# Patient Record
Sex: Female | Born: 1945 | Race: White | Hispanic: No | Smoking: Former smoker
Health system: Southern US, Community
[De-identification: ages and names within clinical notes are randomized; demographics above are authoritative.]

## PROBLEM LIST (undated history)

## (undated) DIAGNOSIS — I499 Cardiac arrhythmia, unspecified: Secondary | ICD-10-CM

## (undated) DIAGNOSIS — J449 Chronic obstructive pulmonary disease, unspecified: Secondary | ICD-10-CM

## (undated) HISTORY — PX: PARATHYROIDECTOMY: SHX19

## (undated) HISTORY — DX: Cardiac arrhythmia, unspecified: I49.9

## (undated) HISTORY — PX: BREAST SURGERY: SHX581

---

## 2007-03-15 ENCOUNTER — Encounter: Payer: Self-pay | Admitting: Internal Medicine

## 2008-05-15 ENCOUNTER — Encounter (HOSPITAL_COMMUNITY): Admission: RE | Admit: 2008-05-15 | Discharge: 2008-06-23 | Payer: Self-pay | Admitting: Surgery

## 2008-05-27 LAB — CONVERTED CEMR LAB: Pap Smear: NORMAL

## 2008-06-17 ENCOUNTER — Encounter: Payer: Self-pay | Admitting: Emergency Medicine

## 2008-07-08 ENCOUNTER — Encounter: Payer: Self-pay | Admitting: Emergency Medicine

## 2008-08-14 ENCOUNTER — Encounter: Payer: Self-pay | Admitting: Emergency Medicine

## 2008-08-26 ENCOUNTER — Encounter (INDEPENDENT_AMBULATORY_CARE_PROVIDER_SITE_OTHER): Payer: Self-pay | Admitting: Surgery

## 2008-08-26 ENCOUNTER — Ambulatory Visit (HOSPITAL_COMMUNITY): Admission: RE | Admit: 2008-08-26 | Discharge: 2008-08-27 | Payer: Self-pay | Admitting: Surgery

## 2008-10-26 ENCOUNTER — Emergency Department (HOSPITAL_COMMUNITY): Admission: EM | Admit: 2008-10-26 | Discharge: 2008-10-26 | Payer: Self-pay | Admitting: Emergency Medicine

## 2008-10-27 ENCOUNTER — Telehealth (INDEPENDENT_AMBULATORY_CARE_PROVIDER_SITE_OTHER): Payer: Self-pay | Admitting: *Deleted

## 2008-10-28 ENCOUNTER — Encounter: Payer: Self-pay | Admitting: Emergency Medicine

## 2008-10-29 ENCOUNTER — Ambulatory Visit: Payer: Self-pay | Admitting: Emergency Medicine

## 2008-10-29 DIAGNOSIS — J45909 Unspecified asthma, uncomplicated: Secondary | ICD-10-CM | POA: Insufficient documentation

## 2008-10-29 DIAGNOSIS — J4489 Other specified chronic obstructive pulmonary disease: Secondary | ICD-10-CM | POA: Insufficient documentation

## 2008-10-29 DIAGNOSIS — J309 Allergic rhinitis, unspecified: Secondary | ICD-10-CM | POA: Insufficient documentation

## 2008-10-29 DIAGNOSIS — J449 Chronic obstructive pulmonary disease, unspecified: Secondary | ICD-10-CM | POA: Insufficient documentation

## 2008-10-30 ENCOUNTER — Encounter: Payer: Self-pay | Admitting: Emergency Medicine

## 2008-10-30 ENCOUNTER — Telehealth: Payer: Self-pay | Admitting: Emergency Medicine

## 2008-11-27 ENCOUNTER — Encounter: Payer: Self-pay | Admitting: Emergency Medicine

## 2008-11-27 ENCOUNTER — Ambulatory Visit: Payer: Self-pay | Admitting: Emergency Medicine

## 2009-01-07 ENCOUNTER — Ambulatory Visit: Payer: Self-pay | Admitting: Internal Medicine

## 2009-01-07 DIAGNOSIS — M899 Disorder of bone, unspecified: Secondary | ICD-10-CM | POA: Insufficient documentation

## 2009-01-07 DIAGNOSIS — E210 Primary hyperparathyroidism: Secondary | ICD-10-CM | POA: Insufficient documentation

## 2009-01-07 DIAGNOSIS — M949 Disorder of cartilage, unspecified: Secondary | ICD-10-CM

## 2009-01-27 ENCOUNTER — Ambulatory Visit: Payer: Self-pay | Admitting: Internal Medicine

## 2009-01-27 DIAGNOSIS — M79609 Pain in unspecified limb: Secondary | ICD-10-CM

## 2009-02-12 ENCOUNTER — Encounter: Payer: Self-pay | Admitting: Internal Medicine

## 2009-02-12 ENCOUNTER — Ambulatory Visit: Payer: Self-pay | Admitting: Internal Medicine

## 2009-02-23 ENCOUNTER — Ambulatory Visit: Payer: Self-pay | Admitting: Emergency Medicine

## 2009-02-25 ENCOUNTER — Encounter: Payer: Self-pay | Admitting: Internal Medicine

## 2009-05-15 ENCOUNTER — Ambulatory Visit: Payer: Self-pay | Admitting: Internal Medicine

## 2009-05-15 LAB — CONVERTED CEMR LAB
Alkaline Phosphatase: 57 units/L (ref 39–117)
BUN: 9 mg/dL (ref 6–23)
Basophils Absolute: 0.1 10*3/uL (ref 0.0–0.1)
Basophils Relative: 0.9 % (ref 0.0–3.0)
Bilirubin, Direct: 0.1 mg/dL (ref 0.0–0.3)
CO2: 32 meq/L (ref 19–32)
Calcium: 9.3 mg/dL (ref 8.4–10.5)
Cholesterol: 207 mg/dL — ABNORMAL HIGH (ref 0–200)
Creatinine, Ser: 0.6 mg/dL (ref 0.4–1.2)
Direct LDL: 126.5 mg/dL
Eosinophils Absolute: 0.2 10*3/uL (ref 0.0–0.7)
Lymphocytes Relative: 19.3 % (ref 12.0–46.0)
MCHC: 34 g/dL (ref 30.0–36.0)
Monocytes Absolute: 0.9 10*3/uL (ref 0.1–1.0)
Neutrophils Relative %: 67.5 % (ref 43.0–77.0)
Platelets: 295 10*3/uL (ref 150.0–400.0)
RBC: 4.14 M/uL (ref 3.87–5.11)
RDW: 11.7 % (ref 11.5–14.6)
Total Bilirubin: 0.8 mg/dL (ref 0.3–1.2)
Total CHOL/HDL Ratio: 3
Triglycerides: 50 mg/dL (ref 0.0–149.0)

## 2009-05-25 ENCOUNTER — Ambulatory Visit: Payer: Self-pay | Admitting: Emergency Medicine

## 2009-06-18 ENCOUNTER — Telehealth (INDEPENDENT_AMBULATORY_CARE_PROVIDER_SITE_OTHER): Payer: Self-pay | Admitting: *Deleted

## 2009-06-27 ENCOUNTER — Telehealth: Payer: Self-pay | Admitting: Internal Medicine

## 2009-08-14 ENCOUNTER — Ambulatory Visit: Payer: Self-pay | Admitting: Emergency Medicine

## 2009-12-17 ENCOUNTER — Ambulatory Visit: Payer: Self-pay | Admitting: Emergency Medicine

## 2010-04-25 ENCOUNTER — Telehealth (INDEPENDENT_AMBULATORY_CARE_PROVIDER_SITE_OTHER): Payer: Self-pay | Admitting: *Deleted

## 2010-05-14 ENCOUNTER — Telehealth (INDEPENDENT_AMBULATORY_CARE_PROVIDER_SITE_OTHER): Payer: Self-pay | Admitting: *Deleted

## 2010-05-14 ENCOUNTER — Ambulatory Visit: Payer: Self-pay | Admitting: Emergency Medicine

## 2010-05-18 ENCOUNTER — Telehealth (INDEPENDENT_AMBULATORY_CARE_PROVIDER_SITE_OTHER): Payer: Self-pay | Admitting: *Deleted

## 2010-05-19 ENCOUNTER — Telehealth (INDEPENDENT_AMBULATORY_CARE_PROVIDER_SITE_OTHER): Payer: Self-pay | Admitting: *Deleted

## 2010-05-24 ENCOUNTER — Telehealth (INDEPENDENT_AMBULATORY_CARE_PROVIDER_SITE_OTHER): Payer: Self-pay | Admitting: *Deleted

## 2010-05-25 ENCOUNTER — Ambulatory Visit: Payer: Self-pay | Admitting: Emergency Medicine

## 2010-05-25 DIAGNOSIS — J069 Acute upper respiratory infection, unspecified: Secondary | ICD-10-CM | POA: Insufficient documentation

## 2010-06-11 ENCOUNTER — Ambulatory Visit: Payer: Self-pay | Admitting: Emergency Medicine

## 2010-06-16 ENCOUNTER — Telehealth: Payer: Self-pay | Admitting: Emergency Medicine

## 2010-07-19 ENCOUNTER — Ambulatory Visit
Admission: RE | Admit: 2010-07-19 | Discharge: 2010-07-19 | Payer: Self-pay | Source: Home / Self Care | Attending: Emergency Medicine | Admitting: Emergency Medicine

## 2010-07-27 NOTE — Miscellaneous (Signed)
Summary: [facility]   Imported By: Lester Henderson 02/05/2010 09:33:06  _____________________________________________________________________  External Attachment:    Type:   Image     Comment:   External Document

## 2010-07-27 NOTE — Progress Notes (Signed)
Summary: bronchitis  Phone Note Call from Patient   Caller: Patient Call For: byrum Summary of Call: pt have bronchitis would like [phone] Initial call taken by: Rickard Patience,  May 19, 2010 8:32 AM  Follow-up for Phone Call        pt called yesterday and c/o  dry hacky cough x 2 days, increased SOB, and chest tightness.  Dr. Kriste Basque prescribed her ceftin, but pt states she also requested prednisone. She staets that she always take abx and prednisone. This is the only thing that makes her better.  Please advise. Carron Curie CMA  May 19, 2010 9:25 AM   Additional Follow-up for Phone Call Additional follow up Details #1::        pt called back again. she says she ALWAYS gets prednisone w/ her abx. [facility] [place] [address] ASAP as she must take this in the am "and it's already noon". pt is "very upset that she has had to call so many times re: this". if this cannot be done soon- she wants to speak to dr of the day and find out why. Tivis Ringer, CNA  May 19, 2010 12:04 PM    Additional Follow-up for Phone Call Additional follow up Details #2::    Spoke with pt and advise that message wil lbe addressed as soon as possible but he is currently see pt. Sh estated understanding but just wants to start prednisone as soon as possible becaus eit keeps her awake. I advised we will take care of message as soon as possible. Carron Curie CMA  May 19, 2010 12:15 PM  Paged Dr. Delton Coombes and explained tha the pt is demanding a pred taper for sob, cough and chest tightness. SN had called in Ceftin for her on 05/18/2010 but did not give prednisone. She says she needs the Prednisone for chest tightness and was not able to come for appt on 11/22 due to other drs appts. I explained to her that RB would agree to the Pred taper this time but in the future she would need an OV. She said no one offered an appt  today but she would have been glad to come in and see someone. She is awae that if her sxs do not improve or get worse she will need OV with RB or one of the other providers if he is not available.  Patient verbalized understanding.    Follow-up by: Michel Bickers CMA,  May 19, 2010 12:53 PM  New/Updated Medications: PREDNISONE 10 MG TABS (PREDNISONE) 4 by mouth for 3 days, 3 by mouth for 3 days, 2 by mouth for 3 days, 1 by mouth for 3 days, then stop Prescriptions: PREDNISONE 10 MG TABS (PREDNISONE) 4 by mouth for 3 days, 3 by mouth for 3 days, 2 by mouth for 3 days, 1 by mouth for 3 days, then stop  #30 x 0   Entered by:   Michel Bickers CMA   Authorized by:   Leslye Peer MD   Signed by:   Michel Bickers CMA on 05/19/2010   Method used:   Electron[facility] to        [facility]  [phone][facility] (retail)       [address]       [place], [place]  [address]       Ph: [phone] or [phone]       Fax: [phone]  RxID:   [id number]

## 2010-07-27 NOTE — Assessment & Plan Note (Signed)
Summary: COPD, hypoxemia   Visit Type:  Follow-up Copy to:  Dr Gerrit Friends Primary Judy Gonzalez/Referring Judy Gonzalez:  Judy Gonzalez  CC:  Follow up, sob better states its due to de-humidifer, and breathing is less labored.  History of Present Illness: 65 yo woman, former smoker (> 75 pk-yrs), dx with COPD around 2007 (has had PFT's at [facility]). Has been on albuterol, xopenex before. On Spiriva once daily, using combivent 1 puff q3h, since March 2008. Has had progressive DOE. ROS as below. Established care here 5/10.   ROV 02/23/09 -- FEV1 6/10 = 0.70 L. Last visit we added Symbicort to Spiriva, she has noticed a huge difference in how she feels!  Better exertional tolerance. Better sleep. No cough, no wheezing (this is an improvement). Was seen by Dr Yetta Barre, had her xanax refilled. Uses her ProAir about once a day. Has been dealing with some significant family stress, but has not gone back to smoking.   ROV 05/25/09 -- Returns for follow up today. She has been doing fairly well in general, although she has been having some trouble with family stressors, some depression due to this. She has not gone back to smoking!! No exacerbations since last visit, no hospitalizations. Taking Symbicort and Spiriva. Wears O2 reliably. Hasn't had the flu shot - doesn't want it because it "gives her the flu."   ROV 08/14/09 -- Feels that she is doing well. Breathing may be better than last visit. Hasn't restarted smoking. No exacerbations since 12/31 when she received clarithro.   ROV 12/17/09 -- F/U for COPD. Stable on current regimen, no exacerbations since last visit. She has multiple isues that she wants to discuss regarding her Vitamin suppliments, diet, her thrush, how she rinses after ICS, how she sterilizes her toothbrush, etc. She also notes that her O2 concentrator filter was dirty and never changed by [facility] until she began to have more SOB and SABA use. She brought the filter in today. Has since been changed and  she feels better.   ROV 05/14/10 -- hx COPD. I treated her for probable AE 10/30, pred + doxy. Feeling better, back to baseline. She has a humidifier that she believes is helping her breathing. Taking Spiriva + Symbicort. Rarely using her ProAir.   Preventive Screening-Counseling & Management  Alcohol-Tobacco     Alcohol drinks/day: 0     Smoking Status: quit     Packs/Day: 10 cigs per day     Year Started: 40 yrs     Year Quit: 2 yrs ago  Caffeine-Diet-Exercise     Does Patient Exercise: no  Current Medications (verified): 1)  Alprazolam 0.5 Mg Tabs (Alprazolam) .... Twice Daily As Needed 2)  Zyrtec Allergy 10 Mg Tabs (Cetirizine Hcl) .... At Bedtime 3)  Aspirin 325 Mg Tabs (Aspirin) .... Once Daily 4)  Proair Hfa 108 (90 Base) Mcg/act Aers (Albuterol Sulfate) .... 2 Puffs Three Times A Day 5)  Spiriva Handihaler 18 Mcg Caps (Tiotropium Bromide Monohydrate) .... Once Daily 6)  Citracal Plus  Tabs (Multiple Minerals-Vitamins) .... Two Times A Day 7)  Magnesium Citrate 400mg  .... 1/2 Two Times A Day 8)  Symbicort 160-4.5 Mcg/act Aero (Budesonide-Formoterol Fumarate) .... 2 Puffs Two Times A Day  Allergies (verified): 1)  ! Effexor 2)  ! * Cymbalta 3)  ! * Ambien 4)  ! * Duoneb 5)  ! * Rozerem 6)  ! Tramadol Hcl 7)  ! * Pneumonia Shot 8)  ! * Flu Vaccination  Vital Signs:  Patient  profile:   65 year old female Height:      66.5 inches Weight:      139 pounds BMI:     22.18 O2 Sat:      95 % on 2.5 L/min Temp:     97.6 degrees F oral Pulse rate:   86 / minute BP sitting:   130 / 70  (left arm)  Vitals Entered By: Renold Genta RCP, LPN (May 14, 2010 12:14 PM)  O2 Flow:  2.5 L/min CC: Follow up, sob better states its due to de-humidifer, breathing is less labored Comments Medications reviewed with patient Renold Genta RCP, LPN  May 14, 2010 12:18 PM    Physical Exam  General:  thin, somewhat anxiousthin.   Head:  normocephalic and atraumatic Nose:   no deformity, discharge, inflammation, or lesions Mouth:  no deformity or lesions Neck:  no masses, thyromegaly, or abnormal cervical nodes, no stridor Lungs:  prolonged exp, no wheeze during normal resp cycle, no wheeze on forced expiration.  Heart:  regular rate and rhythm, S1, S2 without murmurs, rubs, gallops, or clicks Abdomen:  not examined Msk:  some mild DIP deformities bilateral hands Extremities:  no clubbing, cyanosis, edema, or deformity noted Neurologic:  non-focal Skin:  Single raised pale nevus mid upper back.  Psych:  alert and cooperative normal attention span and concentration;    Impression & Recommendations:  Problem # 1:  COPD (ICD-496)  Problem # 2:  ALLERGIC RHINITIS (ICD-477.9)  Other Orders: Est. Patient Level IV (99214) T-2 View CXR (71020TC)  Patient Instructions: 1)  We will perform a CXR today 2)  Please continue your Spiriva and Symbicort 3)  Use ProAir as needed  4)  Wear your oxygen at  5)  Follow up with Dr Delton Coombes in 4 months or as needed.

## 2010-07-27 NOTE — Progress Notes (Signed)
Summary: On Call- Rx Biaxin, prednisone  Phone Note Call from Patient   Summary of Call: On call- 06/26/09- Finished prednisone taper today. Still productive cough and noting some specks of blood. Plan- Biaxin 500 mg two times a day x 7 days. Prednisone 20 mg daily x 7 days. Check wih Dr Delton Coombes in next 1-2 weeks for OV. Initial call taken by: Waymon Budge MD,  June 27, 2009 8:36 PM

## 2010-07-27 NOTE — Progress Notes (Signed)
Summary: cough and congestion no better  Phone Note Call from Patient Call back at Home Phone [phone]   Caller: Patient Call For: byrum Reason for Call: Talk to Nurse Summary of Call: Patient calling with complaints of rash, fever, sob, chills, and swollen lymph glands.  She says this has been going on since 11/24, patient was rx'd abx and prednisone on 11/23 and is no better. Initial call taken by: Lehman Prom,  May 24, 2010 9:10 AM  Follow-up for Phone Call        Pt c/o cough, SOB, low-grade fever since last week. Pt is currently on abx and Pred taper but her sxs are not improving. She did not want to see anybody other than Dr. Delton Coombes and chose to wait for OV with him on Tues., 11/29 @ 2:30pm. She was instructed to call back if her sxs got worse or go to an urgent care or ER. Pt had verbal understanding of this. Follow-up by: Michel Bickers CMA,  May 24, 2010 10:19 AM

## 2010-07-27 NOTE — Progress Notes (Signed)
Summary: bronchitis  Phone Note Call from Patient   Caller: Patient Call For: byrum Summary of Call: pt says that her bronchitis that had improved has come back (since yesterday). call pt on her cell [phone] [address] [facility] [place] Initial call taken by: Tivis Ringer, CNA,  May 18, 2010 12:58 PM  Follow-up for Phone Call        Spoke with pt.  She was last seen by RB on 05/14/10.  She is c/o dry hacky cough x 2 days, increased SOB, and chest tightness.  She states that she is wanting round of doxycycline, this is whar RB normally gives her.  Pls advise if this is okay, thanks! Allergies (verified):  1)  ! Effexor 2)  ! * Cymbalta 3)  ! * Ambien 4)  ! * Duoneb 5)  ! * Rozerem 6)  ! Tramadol Hcl 7)  ! * Pneumonia Shot 8)  ! * Flu Vaccination  Follow-up by: Vernie Murders,  May 18, 2010 2:10 PM  Additional Follow-up for Phone Call Additional follow up Details #1::        per SN: ok for doxycycline 100mg , #14 1 by mouth two times a day.  no refills.  f/u w/ RB.    rx sent to pt's pharmacy of choice.  LMOM TCB to inform pt of this. Boone Master CNA/MA  May 18, 2010 3:09 PM     Additional Follow-up for Phone Call Additional follow up Details #2::    as i was adding the doxycycilne to pt's med list, she returned my call.  patient states that is not requesting doxycycline as she was prescribed this at the end of October.  pt states that RB "usually changes it up" and that "next up is the ceftin."  pt also requesting steroids d/t tightness in the chest.  pt was also prescribed this on 10.31.11 w/ the doxycycline.  please advise, thanks! Boone Master CNA/MA  May 18, 2010 3:14 PM    per SN----ok for pt to have the ceftin 250mg   #14   1 by mouth two times a day until gone. thanks Randell Loop CMA  May 18, 2010 5:21 PM   Additional Follow-up for Phone Call Additional follow up Details #3:: Details for Additional Follow-up Action Taken: Spoke with pt and  notified her that rx was sent to pharm.  Additional Follow-up by: Vernie Murders,  May 18, 2010 5:23 PM  New/Updated Medications: CEFTIN 250 MG TABS (CEFUROXIME AXETIL) 1 two times a day until gone Prescriptions: CEFTIN 250 MG TABS (CEFUROXIME AXETIL) 1 two times a day until gone  #14 x 0   Entered by:   Vernie Murders   Authorized by:   Michele Mcalpine MD   Signed by:   Vernie Murders on 05/18/2010   Method used:   Electronically to        [facility]  [phone]* (retail)       [address]       [place], [place]  [address]       Ph: [phone] or [phone]       Fax: [phone]   RxID:   [id number]

## 2010-07-27 NOTE — Letter (Signed)
Summary: Patient's info/[facility]  Patient's info/[facility]   Imported By: Lester [place] 08/20/2009 08:35:51  _____________________________________________________________________  External Attachment:    Type:   Image     Comment:   External Document

## 2010-07-27 NOTE — Assessment & Plan Note (Signed)
Summary: URI, COPD   Visit Type:  Follow-up Copy to:  Dr Gerrit Friends Primary Provider/Referring Provider:  Durenda Guthrie  CC:  Pt c/o red rash on her face and fever x several days.  Marland Kitchen  History of Present Illness: 65 yo woman, former smoker (> 75 pk-yrs), dx with COPD around 2007 (has had PFT's at [facility]). Established care here 5/10. Has been on Symbicort + Spiriva, O2. Stopped smoking after our initial visit  ROV 12/17/09 -- F/U for COPD. Stable on current regimen, no exacerbations since last visit. She has multiple isues that she wants to discuss regarding her Vitamin suppliments, diet, her thrush, how she rinses after ICS, how she sterilizes her toothbrush, etc. She also notes that her O2 concentrator filter was dirty and never changed by [facility] until she began to have more SOB and SABA use. She brought the filter in today. Has since been changed and she feels better.   ROV 05/14/10 -- hx COPD. I treated her for probable AE 10/30, pred + doxy. Feeling better, back to baseline. She has a humidifier that she believes is helping her breathing. Taking Spiriva + Symbicort. Rarely using her ProAir.   ROV 05/25/10 -- COPD, no longer smoking. Began therapy with doxy + pred 11/22, tapering. Feels about the same. Having swollen glands, flushing, cough. URI symptoms, fever. Has been using ProAir q4h for last several days.   Current Medications (verified): 1)  Alprazolam 0.5 Mg Tabs (Alprazolam) .... Twice Daily As Needed 2)  Zyrtec Allergy 10 Mg Tabs (Cetirizine Hcl) .... At Bedtime 3)  Aspirin 325 Mg Tabs (Aspirin) .... Once Daily 4)  Proair Hfa 108 (90 Base) Mcg/act Aers (Albuterol Sulfate) .... 2 Puffs Three Times A Day 5)  Spiriva Handihaler 18 Mcg Caps (Tiotropium Bromide Monohydrate) .... Once Daily 6)  Citracal Plus  Tabs (Multiple Minerals-Vitamins) .... Two Times A Day 7)  Magnesium Citrate 400mg  .... 1/2 Two Times A Day 8)  Symbicort 160-4.5 Mcg/act Aero (Budesonide-Formoterol Fumarate)  .... 2 Puffs Two Times A Day 9)  Ceftin 250 Mg Tabs (Cefuroxime Axetil) .Marland Kitchen.. 1 Two Times A Day Until Gone 10)  Prednisone 10 Mg Tabs (Prednisone) .... 4 By Mouth For 3 Days, 3 By Mouth For 3 Days, 2 By Mouth For 3 Days, 1 By Mouth For 3 Days, Then Stop 11)  Co Q-10 100 Mg Caps (Coenzyme Q10) .Marland Kitchen.. 1 Once Daily  Allergies (verified): 1)  ! Effexor 2)  ! * Cymbalta 3)  ! * Ambien 4)  ! * Duoneb 5)  ! * Rozerem 6)  ! Tramadol Hcl 7)  ! * Pneumonia Shot 8)  ! * Flu Vaccination  Vital Signs:  Patient profile:   65 year old female Weight:      144.38 pounds O2 Sat:      90 % on 2.5 L/min pulsed Temp:     97.8 degrees F oral Pulse rate:   102 / minute BP sitting:   142 / 78  (left arm)  Vitals Entered By: Vernie Murders (May 25, 2010 2:13 PM)  O2 Flow:  2.5 L/min pulsed  Physical Exam  General:  thin, somewhat anxiousthin.   Head:  normocephalic and atraumatic Nose:  no deformity, discharge, inflammation, or lesions Mouth:  no deformity or lesions Neck:  no masses, thyromegaly, or abnormal cervical nodes, no stridor Lungs:  prolonged exp, no wheeze during normal resp cycle, no wheeze on forced expiration.  Heart:  regular rate and rhythm, S1, S2 without murmurs,  rubs, gallops, or clicks Abdomen:  not examined Msk:  some mild DIP deformities bilateral hands Extremities:  no clubbing, cyanosis, edema, or deformity noted Neurologic:  non-focal Skin:  Single raised pale nevus mid upper back.  Psych:  alert and cooperative normal attention span and concentration;    Impression & Recommendations:  Problem # 1:  COPD (ICD-496)  Problem # 2:  UPPER RESPIRATORY INFECTION (ICD-465.9)  Her updated medication list for this problem includes:    Zyrtec Allergy 10 Mg Tabs (Cetirizine hcl) .Marland Kitchen... At bedtime    Aspirin 325 Mg Tabs (Aspirin) ..... Once daily  Orders: Est. Patient Level IV (78295)  Medications Added to Medication List This Visit: 1)  Co Q-10 100 Mg Caps (Coenzyme  q10) .Marland Kitchen.. 1 once daily  Patient Instructions: 1)  Finish prescriptions for your prednisone and ceftin 2)  Continue your Inhaled medications as you are taking them 3)  Continue your oxygen.  4)  Use OTC decongestants that contain either bromphenerimine or chlorphenerimine.  5)  Follow up with Dr Delton Coombes in 2 weeks or next available.

## 2010-07-27 NOTE — Progress Notes (Signed)
Summary: URI, AE-COPD  Phone Note Call from Patient   Caller: Patient Call For: Byrum Summary of Call: Pt calling to report URI symptoms for last 3 days. Starting to have chest tightness, dry cough, DOE. No purulent mucous. Will treat for AE-COPD with pred taper and doxy. Will move her next appt to November from December. please call her to see if she is better and to move her appointment up. Thanks. Leslye Peer MD  April 25, 2010 1:27 PM   Initial call taken by: Leslye Peer MD,  April 25, 2010 1:26 PM  Follow-up for Phone Call        called pt and she staets she is feeling better today. Advised we neded to change appt. Pt states she discussed with Dr. Delton Coombes about having a pFT at time of next appt. Please advise if you would like pt to have a PFT with Nov. appt.Carron Curie CMA  April 26, 2010 9:39 AM  I'd like to insure that she has recovered before we oprder the PFT's. I'll set them up when I see her if she looks good. Leslye Peer MD  April 26, 2010 12:04 PM   Follow-up by: Leslye Peer MD,  April 26, 2010 12:04 PM  Additional Follow-up for Phone Call Additional follow up Details #1::        Spoke with pt and instructed that Dr Delton Coombes would like to wait to have PFT until after he sees her on 05-14-10.  Pt verbalized understanding. Abigail Miyamoto RN  April 26, 2010 12:21 PM     New/Updated Medications: PREDNISONE 10 MG TABS (PREDNISONE) 40mg  daily x 3days, 30mg  daily x 3days, 20mg  daily x 3days, 10mg  daily x3 days then stop. DOXYCYCLINE HYCLATE 100 MG CAPS (DOXYCYCLINE HYCLATE) 1 by mouth two times a day Prescriptions: DOXYCYCLINE HYCLATE 100 MG CAPS (DOXYCYCLINE HYCLATE) 1 by mouth two times a day  #14 x 0   Entered and Authorized by:   Leslye Peer MD   Signed by:   Leslye Peer MD on 04/25/2010   Method used:   Electronically to        [facility]  [phone]* (retail)       [address]       [place], [place]  [address]       Ph:  [phone] or [phone]       Fax: [phone]   RxID:   [id number] PREDNISONE 10 MG TABS (PREDNISONE) 40mg  daily x 3days, 30mg  daily x 3days, 20mg  daily x 3days, 10mg  daily x3 days then stop.  #30 x 0   Entered and Authorized by:   Leslye Peer MD   Signed by:   Leslye Peer MD on 04/25/2010   Method used:   Electronically to        [facility]  [phone]* (retail)       [address]       [place], [place]  [address]       Ph: [phone] or [phone]       Fax: [phone]   RxID:   [id number]

## 2010-07-27 NOTE — Progress Notes (Signed)
Summary: copy of results to [facility]  Phone Note Call from Patient Call back at Home Phone [phone]   Caller: Patient Call For: byrum Summary of Call: pt requests results of cxr (from today) be sent to dr lyle Katrinka Blazing at [facility]. ok to leave msg on pt's phone.  Initial call taken by: Tivis Ringer, CNA,  May 14, 2010 12:57 PM  Follow-up for Phone Call        Will place in Lori's box so that she can send once it comes back and he signs off on it.   Follow-up by: Vernie Murders,  May 14, 2010 1:18 PM  Additional Follow-up for Phone Call Additional follow up Details #1::        RB has not signed off on cxr report yet. I will sign off on phone note and send a flag to myself to check later this week.Michel Bickers [facility]  May 24, 2010 2:40 PM

## 2010-07-27 NOTE — Assessment & Plan Note (Signed)
Summary: COPD   Visit Type:  Follow-up Copy to:  Dr Gerrit Friends Primary Provider/Referring Provider:  Sondra Come  CC:  COPD. The patient says her inhalers cause nasal congestion. She says her breathing has improved slightly.Marland Kitchen  History of Present Illness: 65 yo woman, former smoker (> 75 pk-yrs), dx with COPD around 2007 (has had PFT's at [facility]). Has been on albuterol, xopenex before. On Spiriva once daily, using combivent 1 puff q3h, since March 2008. Has had progressive DOE. ROS as below. Established care here 5/10.   ROV 02/23/09 -- FEV1 6/10 = 0.70 L. Last visit we added Symbicort to Spiriva, she has noticed a huge difference in how she feels!  Better exertional tolerance. Better sleep. No cough, no wheezing (this is an improvement). Was seen by Dr Yetta Barre, had her xanax refilled. Uses her ProAir about once a day. Has been dealing with some significant family stress, but has not gone back to smoking.   ROV 05/25/09 -- Returns for follow up today. She has been doing fairly well in general, although she has been having some trouble with family stressors, some depression due to this. She has not gone back to smoking!! No exacerbations since last visit, no hospitalizations. Taking Symbicort and Spiriva. Wears O2 reliably. Hasn't had the flu shot - doesn't want it because it "gives her the flu."   ROV 08/14/09 -- Feels that she is doing well. Breathing may be better than last visit. Hasn't restarted smoking. No exacerbations since 12/31 when she received clarithro.   ROV 12/17/09 -- F/U for COPD. Stable on current regimen, no exacerbations since last visit. She has multiple isues that she wants to discuss regarding her Vitamin suppliments, diet, her thrush, how she rinses after ICS, how she sterilizes her toothbrush, etc. She also notes that her O2 concentrator filter was dirty and never changed by [facility] until she began to have more SOB and SABA use. She brought the filter in today. Has since been  changed and she feels better.   Current Medications (verified): 1)  Alprazolam 0.5 Mg Tabs (Alprazolam) .... Twice Daily As Needed 2)  Zyrtec Allergy 10 Mg Tabs (Cetirizine Hcl) .... At Bedtime 3)  Aspirin 325 Mg Tabs (Aspirin) .... Once Daily 4)  Proair Hfa 108 (90 Base) Mcg/act Aers (Albuterol Sulfate) .... 2 Puffs Three Times A Day 5)  Spiriva Handihaler 18 Mcg Caps (Tiotropium Bromide Monohydrate) .... Once Daily 6)  Citracal Plus  Tabs (Multiple Minerals-Vitamins) .... Two Times A Day 7)  Magnesium Citrate 400mg  .... 1/2 Two Times A Day 8)  Symbicort 160-4.5 Mcg/act Aero (Budesonide-Formoterol Fumarate) .... 2 Puffs Two Times A Day  Allergies (verified): 1)  ! Effexor 2)  ! * Cymbalta 3)  ! * Ambien 4)  ! * Duoneb 5)  ! * Rozerem 6)  ! Tramadol Hcl 7)  ! * Pneumonia Shot 8)  ! * Flu Vaccination  Vital Signs:  Patient profile:   65 year old female Height:      67 inches (170.18 cm) Weight:      137 pounds (62.27 kg) BMI:     21.53 O2 Sat:      97 % on Room air Temp:     98.2 degrees F (36.78 degrees C) oral Pulse rate:   71 / minute BP sitting:   114 / 72  (left arm) Cuff size:   regular  Vitals Entered By: Michel Bickers CMA (December 17, 2009 1:44 PM)  O2 Sat at Rest %:  97 O2 Flow:  Room air CC: COPD. The patient says her inhalers cause nasal congestion. She says her breathing has improved slightly. Comments Medications reviewed with the patient. Daytime phone verified. Michel Bickers CMA  December 17, 2009 1:45 PM   Physical Exam  General:  thin, somewhat anxiousthin.   Head:  normocephalic and atraumatic Nose:  no deformity, discharge, inflammation, or lesions Mouth:  no deformity or lesions Neck:  no masses, thyromegaly, or abnormal cervical nodes, no stridor Lungs:  prolonged exp, no wheeze during normal resp cycle, no wheeze on forced expiration.  Heart:  regular rate and rhythm, S1, S2 without murmurs, rubs, gallops, or clicks Abdomen:  not examined Msk:  some  mild DIP deformities bilateral hands Extremities:  no clubbing, cyanosis, edema, or deformity noted Neurologic:  non-focal Skin:  Single raised pale nevus mid upper back.  Psych:  alert and cooperative normal attention span and concentration;    Impression & Recommendations:  Problem # 1:  COPD (ICD-496) Same BDs O2 as ordered with filter change q67mo ROV 6 mo she has refused flu shot in the past, will revisit with her next fall, see if she would like to get it  Problem # 2:  ALLERGIC RHINITIS (ICD-477.9)  Her updated medication list for this problem includes:    Zyrtec Allergy 10 Mg Tabs (Cetirizine hcl) .Marland Kitchen... At bedtime  Orders: Est. Patient Level IV (16109)  Medications Added to Medication List This Visit: 1)  Aspirin 325 Mg Tabs (Aspirin) .... Once daily 2)  Proair Hfa 108 (90 Base) Mcg/act Aers (Albuterol sulfate) .... 2 puffs three times a day 3)  Citracal Plus Tabs (Multiple minerals-vitamins) .... Two times a day 4)  Magnesium Citrate 400mg   .... 1/2 two times a day  Patient Instructions: 1)  Please continue your Spiriva once daily and Symbicort two times a day as you are taking them  2)  Continue your oxygen at all times. [facility] should change your concentrator filter EVERY 6 months 3)  Follow up with Dr Delton Coombes in 6 months or as needed for any problems.

## 2010-07-27 NOTE — Miscellaneous (Signed)
Summary: Living Will  Living Will   Imported By: [place] Delton 02/05/2010 09:28:50  _____________________________________________________________________  External Attachment:    Type:   Image     Comment:   External Document

## 2010-07-27 NOTE — Assessment & Plan Note (Signed)
Summary: COPD   Visit Type:  Follow-up Copy to:  Dr Gerrit Friends Primary Provider/Referring Provider:  Sondra Come  CC:  COPD. The patient states there is no changes in her breathing.Marland Kitchen  History of Present Illness: 65 yo woman, former smoker (> 75 pk-yrs), dx with COPD around 2007 (has had PFT's at [facility]). Has been on albuterol, xopenex before. On Spiriva once daily, using combivent 1 puff q3h, since March 2008. Has had progressive DOE. ROS as below. Established care here 5/10.   ROV 11/27/08 -- last time we discovered exertional hypoxemia, she has nocturnal oximetry that shows desaturations. Has been wearing oxygen since last visit - has more energy, able to do more without stopping. We changed combivent to ProAir as needed at our initial visit, she believes that her breathing may have declined  ROV 02/23/09 -- FEV1 6/10 = 0.70 L. Last visit we added Symbicort to Spiriva, she has noticed a huge difference in how she feels!  Better exertional tolerance. Better sleep. No cough, no wheezing (this is an improvement). Was seen by Dr Yetta Barre, had her xanax refilled. Uses her ProAir about once a day. Has been dealing with some significant family stress, but has not gone back to smoking.   ROV 05/25/09 -- Returns for follow up today. She has been doing fairly well in general, although she has been having some trouble with family stressors, some depression due to this. She has not gone back to smoking!! No exacerbations since last visit, no hospitalizations. Taking Symbicort and Spiriva. Wears O2 reliably. Hasn't had the flu shot - doesn't want it because it "gives her the flu."   ROV 08/14/09 -- Feels that she is doing well. Breathing may be better than last visit. Hasn't restarted smoking. No exacerbations since 12/31 when she received clarithro.   Allergies: 1)  ! Effexor 2)  ! * Cymbalta 3)  ! * Ambien 4)  ! * Duoneb 5)  ! * Rozerem 6)  ! Tramadol Hcl 7)  ! * Pneumonia Shot 8)  ! * Flu  Vaccination  Vital Signs:  Patient profile:   65 year old female Height:      67 inches (170.18 cm) Weight:      138 pounds (62.73 kg) BMI:     21.69 O2 Sat:      93 % on 2.5 L/min Temp:     97.8 degrees F (36.56 degrees C) oral Pulse rate:   95 / minute BP sitting:   140 / 76  (left arm) Cuff size:   regular  Vitals Entered By: Michel Bickers CMA (August 14, 2009 11:55 AM)  O2 Sat at Rest %:  93 O2 Flow:  2.5 L/min CC: COPD. The patient states there is no changes in her breathing. Comments The patient would not give me her list of medications or allergies so that I could update this in her chart. She preferred to go over this with Dr. Delton Coombes. Michel Bickers [facility]  August 14, 2009 11:58 AM   Physical Exam  General:  thin, somewhat anxiousthin.   Head:  normocephalic and atraumatic Nose:  no deformity, discharge, inflammation, or lesions Mouth:  no deformity or lesions Neck:  no masses, thyromegaly, or abnormal cervical nodes, no stridor Lungs:  prolonged exp, no wheeze during normal resp cycle, no wheeze on forced expiration.  Heart:  regular rate and rhythm, S1, S2 without murmurs, rubs, gallops, or clicks Abdomen:  not examined Msk:  some mild DIP deformities bilateral hands Extremities:  no clubbing, cyanosis, edema, or deformity noted Neurologic:  non-focal Skin:  Single raised pale nevus mid upper back.  Psych:  anxious but well-oriented.  Good historian.     Impression & Recommendations:  Problem # 1:  COPD (ICD-496)  Other Orders: Est. Patient Level IV (13086)  Patient Instructions: 1)  Continue your same inhaled medications as your are taking them.  2)  Continue your oxygen at all times.  3)  Follow up with Dr Delton Coombes in 4 months or as needed.  Prescriptions: PROAIR HFA 108 (90 BASE) MCG/ACT AERS (ALBUTEROL SULFATE) 2 puffs q4h as needed for SOB  #8.5 Inhaler x 11   Entered and Authorized by:   Leslye Peer MD   Signed by:   Leslye Peer MD on 08/14/2009    Method used:   Electronically to        [facility]  [phone]* (retail)       [address]       [place], [place]  [address]       Ph: [phone] or [phone]       Fax: [phone]   RxID:   [id number] SYMBICORT 160-4.5 MCG/ACT AERO (BUDESONIDE-FORMOTEROL FUMARATE) 2 puffs two times a day  #1 x 11   Entered and Authorized by:   Leslye Peer MD   Signed by:   Leslye Peer MD on 08/14/2009   Method used:   Electronically to        [facility]  [phone]* (retail)       [address]       [place], [place]  [address]       Ph: [phone] or [phone]       Fax: [phone]   RxID:   [id number] SPIRIVA HANDIHALER 18 MCG CAPS (TIOTROPIUM BROMIDE MONOHYDRATE) once daily  #30 x 11   Entered and Authorized by:   Leslye Peer MD   Signed by:   Leslye Peer MD on 08/14/2009   Method used:   Electronically to        [facility]  [phone]* (retail)       [address]       [place], [place]  [address]       Ph: [phone] or [phone]       Fax: [phone]   RxID:   [id number]    Immunization History:  Tetanus/Td Immunization History:    Tetanus/Td:  historical (07/10/2009)

## 2010-07-29 NOTE — Assessment & Plan Note (Signed)
Summary: COPD   Visit Type:  Follow-up Copy to:  Dr Gerrit Friends Primary Provider/Referring Provider:  Durenda Guthrie  CC:  COPD...URI...pt says it is difficult to breathe, still has cough, nausea this morning, and sneezing.  History of Present Illness: 65 yo woman, former smoker (> 75 pk-yrs), dx with COPD around 2007 (has had PFT's at [facility]). Established care here 5/10. Has been on Symbicort + Spiriva, O2. Stopped smoking after our initial visit  ROV 12/17/09 -- F/U for COPD. Stable on current regimen, no exacerbations since last visit. She has multiple isues that she wants to discuss regarding her Vitamin suppliments, diet, her thrush, how she rinses after ICS, how she sterilizes her toothbrush, etc. She also notes that her O2 concentrator filter was dirty and never changed by [facility] until she began to have more SOB and SABA use. She brought the filter in today. Has since been changed and she feels better.   ROV 05/14/10 -- hx COPD. I treated her for probable AE 10/30, pred + doxy. Feeling better, back to baseline. She has a humidifier that she believes is helping her breathing. Taking Spiriva + Symbicort. Rarely using her ProAir.   ROV 05/25/10 -- COPD, no longer smoking. Began therapy with doxy + pred 11/22, tapering. Feels about the same. Having swollen glands, flushing, cough. URI symptoms, fever. Has been using ProAir q4h for last several days.   ROV 06/11/10 -- COPD, seen 2 weeks ago for URI. Her cough, other symptoms are resolved. She is currently having an intestinal viral infxn. Taking good diet, lots of fluids. Taking her Symbicort and Spiriva.   Current Medications (verified): 1)  Alprazolam 0.5 Mg Tabs (Alprazolam) .... Twice Daily As Needed 2)  Zyrtec Allergy 10 Mg Tabs (Cetirizine Hcl) .... At Bedtime 3)  Aspirin 325 Mg Tabs (Aspirin) .... Once Daily 4)  Proair Hfa 108 (90 Base) Mcg/act Aers (Albuterol Sulfate) .... 2 Puffs Three Times A Day 5)  Spiriva Handihaler 18 Mcg Caps  (Tiotropium Bromide Monohydrate) .... Once Daily 6)  Citracal Plus  Tabs (Multiple Minerals-Vitamins) .... Two Times A Day 7)  Magnesium Citrate 400mg  .... 1/2 Two Times A Day 8)  Symbicort 160-4.5 Mcg/act Aero (Budesonide-Formoterol Fumarate) .... 2 Puffs Two Times A Day 9)  Co Q-10 100 Mg Caps (Coenzyme Q10) .Marland Kitchen.. 1 Once Daily  Allergies (verified): 1)  ! Effexor 2)  ! * Cymbalta 3)  ! * Ambien 4)  ! * Duoneb 5)  ! * Rozerem 6)  ! Tramadol Hcl 7)  ! * Pneumonia Shot 8)  ! * Flu Vaccination  Vital Signs:  Patient profile:   65 year old female Height:      66.5 inches (168.91 cm) Weight:      144 pounds (65.45 kg) BMI:     22.98 O2 Sat:      93 % on 2.5 L/min pulsing Temp:     97.8 degrees F (36.56 degrees C) oral Pulse rate:   92 / minute BP sitting:   138 / 80  (left arm) Cuff size:   regular  Vitals Entered By: Michel Bickers CMA (June 11, 2010 3:59 PM)  O2 Sat at Rest %:  93 O2 Flow:  2.5 L/min pulsing CC: COPD...URI...pt says it is difficult to breathe, still has cough, nausea this morning, sneezing Comments Medications reviewed with patient Michel Bickers [facility]  June 11, 2010 3:58 PM   Physical Exam  General:  thin, somewhat anxiousthin.   Head:  normocephalic and atraumatic  Nose:  no deformity, discharge, inflammation, or lesions Mouth:  no deformity or lesions Neck:  no masses, thyromegaly, or abnormal cervical nodes, no stridor Lungs:  prolonged exp, no wheeze during normal resp cycle, no wheeze on forced expiration.  Heart:  regular rate and rhythm, S1, S2 without murmurs, rubs, gallops, or clicks Abdomen:  not examined Msk:  some mild DIP deformities bilateral hands Extremities:  no clubbing, cyanosis, edema, or deformity noted Neurologic:  non-focal Skin:  Single raised pale nevus mid upper back.  Psych:  alert and cooperative normal attention span and concentration;    Impression & Recommendations:  Problem # 1:  COPD (ICD-496)  Problem # 2:   ALLERGIC RHINITIS (ICD-477.9)  Her updated medication list for this problem includes:    Zyrtec Allergy 10 Mg Tabs (Cetirizine hcl) .Marland Kitchen... At bedtime  Other Orders: Est. Patient Level III (16109)  Patient Instructions: 1)  Please continue your oxygen, Spiriva and Symbicort.  2)  Use your ProAir as needed  3)  Follow up with Dr Delton Coombes in 4 months or as needed.

## 2010-07-29 NOTE — Assessment & Plan Note (Signed)
Summary: COPD   Visit Type:  Follow-up Copy to:  Dr Gerrit Friends Primary Provider/Referring Provider:  Durenda Guthrie  CC:  Pt says her breathing has improved.  Needs DMV forms filled out today.Marland Kitchen  History of Present Illness: 65 yo woman, former smoker (> 75 pk-yrs), dx with COPD around 2007 (has had PFT's at [facility]). Established care here 5/10. Has been on Symbicort + Spiriva, O2. Stopped smoking after our initial visit  ROV 12/17/09 -- F/U for COPD. Stable on current regimen, no exacerbations since last visit. She has multiple isues that she wants to discuss regarding her Vitamin suppliments, diet, her thrush, how she rinses after ICS, how she sterilizes her toothbrush, etc. She also notes that her O2 concentrator filter was dirty and never changed by [facility] until she began to have more SOB and SABA use. She brought the filter in today. Has since been changed and she feels better.   ROV 05/14/10 -- hx COPD. I treated her for probable AE 10/30, pred + doxy. Feeling better, back to baseline. She has a humidifier that she believes is helping her breathing. Taking Spiriva + Symbicort. Rarely using her ProAir.   ROV 05/25/10 -- COPD, no longer smoking. Began therapy with doxy + pred 11/22, tapering. Feels about the same. Having swollen glands, flushing, cough. URI symptoms, fever. Has been using ProAir q4h for last several days.   ROV 06/11/10 -- COPD, seen 2 weeks ago for URI. Her cough, other symptoms are resolved. She is currently having an intestinal viral infxn. Taking good diet, lots of fluids. Taking her Symbicort and Spiriva.   ROV 07/19/10 -- COPD, doing well since last visit. Feels better since her viral gastroenteritis. Taking Spiriva and Symbicort. Rare ProAir use. Needs paperwork filled out for [facility] today so that she can drive with her O2 on.   Current Medications (verified): 1)  Alprazolam 0.5 Mg Tabs (Alprazolam) .... Twice Daily As Needed 2)  Zyrtec Allergy 10 Mg Tabs (Cetirizine Hcl)  .... At Bedtime 3)  Aspirin 325 Mg Tabs (Aspirin) .... Once Daily 4)  Proair Hfa 108 (90 Base) Mcg/act Aers (Albuterol Sulfate) .... 2 Puffs Three Times A Day 5)  Spiriva Handihaler 18 Mcg Caps (Tiotropium Bromide Monohydrate) .... Once Daily 6)  Citracal Plus  Tabs (Multiple Minerals-Vitamins) .... Two Times A Day 7)  Magnesium Oxide 400 Mg Tabs (Magnesium Oxide) .... 1/2 By Mouth Two Times A Day 8)  Symbicort 160-4.5 Mcg/act Aero (Budesonide-Formoterol Fumarate) .... 2 Puffs Two Times A Day 9)  Co Q-10 100 Mg Caps (Coenzyme Q10) .Marland Kitchen.. 1 Once Daily  Allergies (verified): 1)  ! Effexor 2)  ! * Cymbalta 3)  ! * Ambien 4)  ! * Duoneb 5)  ! * Rozerem 6)  ! Tramadol Hcl 7)  ! * Pneumonia Shot 8)  ! * Flu Vaccination  Vital Signs:  Patient profile:   65 year old female Height:      66.5 inches (168.91 cm) Weight:      142.38 pounds (64.72 kg) BMI:     22.72 O2 Sat:      94 % on 2.5 L/min Temp:     97.9 degrees F (36.61 degrees C) oral Pulse rate:   102 / minute BP sitting:   130 / 74  (left arm) Cuff size:   regular  Vitals Entered By: Michel Bickers CMA (July 19, 2010 4:07 PM)  O2 Sat at Rest %:  94 O2 Flow:  2.5 L/min CC: Pt says her  breathing has improved.  Needs DMV forms filled out today. Comments Medications reviewed with patient Michel Bickers [facility]  July 19, 2010 4:08 PM   Physical Exam  General:  thin, somewhat anxiousthin.   Head:  normocephalic and atraumatic Nose:  no deformity, discharge, inflammation, or lesions Mouth:  no deformity or lesions Neck:  no masses, thyromegaly, or abnormal cervical nodes, no stridor Lungs:  prolonged exp, no wheeze during normal resp cycle, no wheeze on forced expiration.  Heart:  regular rate and rhythm, S1, S2 without murmurs, rubs, gallops, or clicks Abdomen:  not examined Msk:  some mild DIP deformities bilateral hands Extremities:  no clubbing, cyanosis, edema, or deformity noted Neurologic:  non-focal Skin:  Single raised  pale nevus mid upper back.  Psych:  alert and cooperative normal attention span and concentration;    Impression & Recommendations:  Problem # 1:  COPD (ICD-496)  Medications Added to Medication List This Visit: 1)  Magnesium Oxide 400 Mg Tabs (Magnesium oxide) .... 1/2 by mouth two times a day  Other Orders: Est. Patient Level IV (16109)  Patient Instructions: 1)  Please continue your oxygen as ordered 2)  Continue your Spiriva and Symbicort 3)  Follow with Dr Delton Coombes in 4 months or as needed

## 2010-07-29 NOTE — Progress Notes (Signed)
Summary: refill spiriva  Phone Note Call from Patient Call back at Home Phone [phone]   Caller: Patient Call For: byrum Reason for Call: Refill Medication Summary of Call: Pt states she just went on medicare 12/1 and wants to get a 90-day supply of spiriva handihaler 18 micrograms before the year is out pls advise.//[facility] [address] Initial call taken by: Darletta Moll,  June 16, 2010 12:44 PM  Follow-up for Phone Call        refill sent pt aware.Carron Curie CMA  June 16, 2010 3:27 PM     Prescriptions: SPIRIVA HANDIHALER 18 MCG CAPS (TIOTROPIUM BROMIDE MONOHYDRATE) once daily  #90 x 1   Entered by:   Carron Curie CMA   Authorized by:   Leslye Peer MD   Signed by:   Carron Curie CMA on 06/16/2010   Method used:   Electronically to        [facility]  [phone]* (retail)       [address]       [place], [place]  [address]       Ph: [phone] or [phone]       Fax: [phone]   RxID:   [id number]

## 2010-09-22 ENCOUNTER — Encounter: Payer: Self-pay | Admitting: Critical Care Medicine

## 2010-09-22 ENCOUNTER — Ambulatory Visit (INDEPENDENT_AMBULATORY_CARE_PROVIDER_SITE_OTHER): Payer: Medicare Other | Admitting: Critical Care Medicine

## 2010-09-22 ENCOUNTER — Encounter: Payer: Self-pay | Admitting: Emergency Medicine

## 2010-09-22 VITALS — BP 130/60 | HR 87 | Temp 97.3°F | Ht 66.0 in | Wt 140.2 lb

## 2010-09-22 DIAGNOSIS — J209 Acute bronchitis, unspecified: Secondary | ICD-10-CM

## 2010-09-22 DIAGNOSIS — J4489 Other specified chronic obstructive pulmonary disease: Secondary | ICD-10-CM

## 2010-09-22 DIAGNOSIS — J449 Chronic obstructive pulmonary disease, unspecified: Secondary | ICD-10-CM

## 2010-09-22 DIAGNOSIS — J309 Allergic rhinitis, unspecified: Secondary | ICD-10-CM

## 2010-09-22 MED ORDER — FLUTICASONE PROPIONATE 50 MCG/ACT NA SUSP: 2.0000 | Freq: Every day | NASAL | Status: DC

## 2010-09-22 MED ORDER — CLARITHROMYCIN 500 MG PO TABS: 500.0000 mg | ORAL_TABLET | Freq: Two times a day (BID) | ORAL | Status: AC

## 2010-09-22 MED ORDER — PREDNISONE 10 MG PO TABS: 10.0000 mg | ORAL_TABLET | Freq: Every day | ORAL | Status: AC

## 2010-09-22 NOTE — Assessment & Plan Note (Signed)
Flare of rhinitis with spring pollen Plan flonase once daily

## 2010-09-22 NOTE — Patient Instructions (Addendum)
Take Prednisone 10mg  Take 4 for two days, three for two days, two for two days, then one for two days and stop Take Biaxin one twice daily for 7days Use Saline to rinse out nares Flonase two sprays each nostril daily x one cannister No other medication changes Return Dr Delton Coombes in 6weeks or sooner if necessary

## 2010-09-22 NOTE — Assessment & Plan Note (Signed)
Acute tracheobronchitis with flare Acute rhinitis with early sinusitis Plan Biaxin x 7days Saline rinse Pulse prednisone

## 2010-09-22 NOTE — Progress Notes (Signed)
  Subjective:    Patient ID: Akirra Lacerda, female    DOB: Nov 01, 1945, 65 y.o.   MRN: [id number]  Cough This is a new problem. The current episode started in the past 7 days. The problem has been gradually worsening. Episode frequency: intermittently throughout the day. The cough is productive of sputum (occ non productive, not much cough at night). Associated symptoms include chest pain, chills, a fever, headaches, myalgias, nasal congestion, postnasal drip, rhinorrhea, shortness of breath and wheezing. Pertinent negatives include no heartburn, hemoptysis, sore throat or sweats. Associated symptoms comments: Notes chest tightness. The symptoms are aggravated by lying down. Her past medical history is significant for bronchitis and COPD. There is no history of pneumonia.  Shortness of Breath This is a chronic problem. The current episode started in the past 7 days. The problem occurs daily. The problem has been gradually worsening. Associated symptoms include chest pain, a fever, headaches, rhinorrhea, sputum production and wheezing. Pertinent negatives include no hemoptysis, leg pain, leg swelling, sore throat or syncope. Her past medical history is significant for COPD. There is no history of pneumonia.    65 yo woman, former smoker (> 75 pk-yrs), dx with COPD around 2007 (has had PFT's at [facility]). Established care here 5/10. Has been on Symbicort + Spiriva, O2. Stopped smoking after our initial visit  09/22/2010 Work in for [employer] pt.  Pt with COPD. On symbicort /spiriva.  Now for one week,  More cough, ?pollen related.  Mucus now yellow, pt is aching, chest is tight, more dyspnea, low grad fever.  T99.   See above symptoms  Review of Systems  Constitutional: Positive for fever and chills.  HENT: Positive for rhinorrhea and postnasal drip. Negative for sore throat.   Respiratory: Positive for cough, sputum production, shortness of breath and wheezing. Negative for hemoptysis.   Cardiovascular:  Positive for chest pain. Negative for leg swelling and syncope.  Gastrointestinal: Negative for heartburn.  Musculoskeletal: Positive for myalgias.  Neurological: Positive for headaches.       Objective:   Physical Exam Gen: Pleasant, well-nourished, in no distress,  normal affect  ENT: erythema ,  mouth clear,  oropharynx clear, mod postnasal drip  Neck: No JVD, no TMG, no carotid bruits  Lungs: No use of accessory muscles, no dullness to percussion, exp wheezes, poor airflow  Cardiovascular: RRR, heart sounds normal, no murmur or gallops, no peripheral edema  Abdomen: soft and NT, no HSM,  BS normal  Musculoskeletal: No deformities, no cyanosis or clubbing  Neuro: alert, non focal  Skin: Warm, no lesions or rashes         Assessment & Plan:

## 2010-09-28 ENCOUNTER — Telehealth: Payer: Self-pay | Admitting: Emergency Medicine

## 2010-09-28 MED ORDER — DOXYCYCLINE HYCLATE 100 MG PO CAPS: ORAL_CAPSULE | ORAL | Status: DC

## 2010-09-28 NOTE — Telephone Encounter (Signed)
Per sn pt can have doxycycline but pt needs to finish current round of rped and f/u with Dr. Delton Coombes. TD spoke with pt. Rx was sent to pharmacy

## 2010-09-28 NOTE — Telephone Encounter (Signed)
Per SN offer Levaquin 750 mg #7 1 po qd and f/u w/ Dr. Delton Coombes ASAP. Called and spoke with pt and she states she does not feel comfortable taking Levaquin bc she has never taken that before and states she has many medication allergies she does not want to take a chance of having a reaction. I advised pt we have a list of her medication allergies and is aware of what she is allergic to. Pt requesting to have doxycycline called in as well as having prednisone called in bc pt states "Dr. Delton Coombes does this for her all the time when she calls in". Pt was not happy with just having any abx called in. Pt is coming in to see Dr. Delton Coombes on 4/10 at 2:30. Please advise Dr. Kriste Basque. Thanks  Allergies  Allergen Reactions  . Duloxetine   . Duoneb   . Influenza Virus Vaccine   . Pneumococcal Vaccine   . Ramelteon   . Tramadol Hcl   . Venlafaxine   . Zolpidem Tartrate

## 2010-09-28 NOTE — Telephone Encounter (Signed)
Called, spoke with pt.  She was seen by Dr. Delford Field on 09/22/10 for acute visit.  States she has improved but still not better.  States she is still having chest tightness but this has improved, weakness, and some SOB with exertion.  States fever did not break until last night.  It is 98.6 this am but states "this is a fever for me."  She has 2 doses of biaxin left and 2 doses of prednisone left.  States Br. Byrum has her on a cycle of 3 different abx - the next one would be doxy.  I offered her OV for tomorrow but she declined.  She is requesting something else be called in for her.  Dr. Delton Coombes and Dr. Delford Field are out of the office.  Will forward message to doc of the day to address -- Dr. Kriste Basque, pls advise. Thanks! Allergies verified [facility] [address]  Allergies  Allergen Reactions  . Duloxetine   . Duoneb   . Influenza Virus Vaccine   . Pneumococcal Vaccine   . Ramelteon   . Tramadol Hcl   . Venlafaxine   . Zolpidem Tartrate   ;

## 2010-09-28 NOTE — Telephone Encounter (Signed)
Patient called back stated that she is not trying to be difficult but she does not want to take antibiotics out of the Levaquin group. Stated that one of the side effects is problems with your brain and she has had this in the past. She states that if Dr Kriste Basque doesn't think that Doxycyline will not help then she would rather have Ceftin if possible. She would also like a script for prednisone. She isn't trying to prescribe for her self but she knows what works for her. She is very tired,  weak and has a fever and she would like to get some medicine that will help her. She can be reached at [phone]

## 2010-09-30 ENCOUNTER — Encounter: Payer: Self-pay | Admitting: Emergency Medicine

## 2010-10-05 ENCOUNTER — Encounter: Payer: Self-pay | Admitting: Emergency Medicine

## 2010-10-05 ENCOUNTER — Ambulatory Visit (INDEPENDENT_AMBULATORY_CARE_PROVIDER_SITE_OTHER): Payer: Medicare Other | Admitting: Emergency Medicine

## 2010-10-05 VITALS — BP 126/72 | HR 87 | Temp 98.1°F | Ht 66.0 in | Wt 139.4 lb

## 2010-10-05 DIAGNOSIS — J4489 Other specified chronic obstructive pulmonary disease: Secondary | ICD-10-CM

## 2010-10-05 DIAGNOSIS — J449 Chronic obstructive pulmonary disease, unspecified: Secondary | ICD-10-CM

## 2010-10-05 LAB — URINALYSIS, ROUTINE W REFLEX MICROSCOPIC
Nitrite: NEGATIVE
Specific Gravity, Urine: 1.007 (ref 1.005–1.030)
Urobilinogen, UA: 0.2 mg/dL (ref 0.0–1.0)

## 2010-10-05 LAB — COMPREHENSIVE METABOLIC PANEL
ALT: 21 U/L (ref 0–35)
AST: 24 U/L (ref 0–37)
Alkaline Phosphatase: 73 U/L (ref 39–117)
CO2: 31 mEq/L (ref 19–32)
Calcium: 9.8 mg/dL (ref 8.4–10.5)
GFR calc Af Amer: >60 mL/min (ref >60)
GFR calc non Af Amer: >60 mL/min (ref >60)
Potassium: 4.2 mEq/L (ref 3.5–5.1)
Sodium: 143 mEq/L (ref 135–145)
Total Protein: 6.9 g/dL (ref 6.0–8.3)

## 2010-10-05 LAB — BLOOD GAS, ARTERIAL
Bicarbonate: 27.7 mEq/L — ABNORMAL HIGH (ref 20.0–24.0)
TCO2: 23.7 mmol/L (ref 0–100)
pH, Arterial: 7.476 — ABNORMAL HIGH (ref 7.350–7.400)
pO2, Arterial: 90.8 mmHg (ref 80.0–100.0)

## 2010-10-05 LAB — CARDIAC PANEL(CRET KIN+CKTOT+MB+TROPI): Relative Index: INVALID (ref 0.0–2.5)

## 2010-10-05 NOTE — Progress Notes (Signed)
  Subjective:    Patient ID: Judy Gonzalez, female    DOB: 01-09-1946, 65 y.o.   MRN: [id number]  HPI 65 yo woman, former smoker (> 75 pk-yrs), dx with COPD around 2007 (has had PFT's at [facility]). Established care here 5/10. Has been on Symbicort + Spiriva, O2. Stopped smoking after our initial visit  ROV 07/19/10 -- COPD, doing well since last visit. Feels better since her viral gastroenteritis. Taking Spiriva and Symbicort. Rare ProAir use. Needs paperwork filled out for [facility] today so that she can drive with her O2 on.   ROV 10/05/10 -- COPD, managed on Spiriva + Symbicort. Seen 3/28 by Dr Delford Field for possible acute bronchitis, treated with pred taper and biaxin. Her breathing and cough are better, but she is still worried because she is having fever - T 98.6 - 99.2. She has been lethargic, aches.    Review of Systems  Constitutional: Positive for fever and fatigue.  HENT: Negative for congestion.   Respiratory: Negative for cough, shortness of breath, wheezing and stridor.        Objective:   Physical Exam Gen: Pleasant, well-nourished, in no distress,  normal affect  ENT: No lesions,  mouth clear,  oropharynx clear, no postnasal drip  Neck: No JVD, no TMG, no carotid bruits  Lungs: No use of accessory muscles, no dullness to percussion, clear without rales or rhonchi  Cardiovascular: RRR, heart sounds normal, no murmur or gallops, no peripheral edema  Abdomen: soft and NT, no HSM,  BS normal  Musculoskeletal: No deformities, no cyanosis or clubbing  Neuro: alert, non focal  Skin: Warm, no lesions or rashes          Assessment & Plan:

## 2010-10-05 NOTE — Assessment & Plan Note (Signed)
AE resolved, now back close to baseline; may be recovering from residual viral syndrome with low grade temps - same regimen

## 2010-10-05 NOTE — Patient Instructions (Signed)
Please continue your oxygen Continue your inhaled medications as you are taking them Call our office if you worsen in any way Follow up with Dr Delton Coombes in 4 months or sooner if needed

## 2010-10-07 LAB — CALCIUM
Calcium: 8 mg/dL — ABNORMAL LOW (ref 8.4–10.5)
Calcium: 8.8 mg/dL (ref 8.4–10.5)

## 2010-10-12 LAB — URINALYSIS, ROUTINE W REFLEX MICROSCOPIC
Bilirubin Urine: NEGATIVE
Ketones, ur: NEGATIVE mg/dL
Nitrite: NEGATIVE
Protein, ur: NEGATIVE mg/dL
Urobilinogen, UA: 0.2 mg/dL (ref 0.0–1.0)
pH: 7.5 (ref 5.0–8.0)

## 2010-10-12 LAB — CBC
HCT: 44.2 % (ref 36.0–46.0)
Platelets: 264 10*3/uL (ref 150–400)
RDW: 12.4 % (ref 11.5–15.5)

## 2010-10-12 LAB — BASIC METABOLIC PANEL
BUN: 7 mg/dL (ref 6–23)
GFR calc non Af Amer: >60 mL/min (ref >60)
Glucose, Bld: 101 mg/dL — ABNORMAL HIGH (ref 70–99)
Potassium: 4.3 mEq/L (ref 3.5–5.1)

## 2010-10-12 LAB — DIFFERENTIAL
Basophils Absolute: 0.1 10*3/uL (ref 0.0–0.1)
Eosinophils Absolute: 0.1 10*3/uL (ref 0.0–0.7)
Eosinophils Relative: 2 % (ref 0–5)
Lymphocytes Relative: 20 % (ref 12–46)

## 2010-10-12 LAB — PROTIME-INR: Prothrombin Time: 13.5 seconds (ref 11.6–15.2)

## 2010-10-14 ENCOUNTER — Other Ambulatory Visit: Payer: Self-pay | Admitting: Emergency Medicine

## 2010-10-14 DIAGNOSIS — J449 Chronic obstructive pulmonary disease, unspecified: Secondary | ICD-10-CM

## 2010-10-21 ENCOUNTER — Telehealth: Payer: Self-pay | Admitting: Emergency Medicine

## 2010-10-21 NOTE — Telephone Encounter (Signed)
Pt c/o incr temp to 99.4, very lethargic and cough is prod. Pt aware RB is out of the office but agrees to see Tp tomorrow, Fri., 10/22/2010 @ 9:30 am. Will seek emergency help if her symptoms get worse before then.

## 2010-10-21 NOTE — Telephone Encounter (Signed)
PT CALLED BACK AGAIN- WANTS TO MAKE SURE NURSE CALLS HER BACK TODAY. Judy Gonzalez

## 2010-10-22 ENCOUNTER — Ambulatory Visit (INDEPENDENT_AMBULATORY_CARE_PROVIDER_SITE_OTHER)
Admission: RE | Admit: 2010-10-22 | Discharge: 2010-10-22 | Disposition: A | Discharge status: Home / Self Care | Payer: Medicare Other | Source: Ambulatory Visit | Attending: Adult Health | Admitting: Adult Health

## 2010-10-22 ENCOUNTER — Encounter: Payer: Self-pay | Admitting: Adult Health

## 2010-10-22 ENCOUNTER — Ambulatory Visit (INDEPENDENT_AMBULATORY_CARE_PROVIDER_SITE_OTHER): Payer: Medicare Other | Admitting: Adult Health

## 2010-10-22 VITALS — BP 124/70 | HR 96 | Temp 97.0°F | Ht 66.75 in | Wt 139.0 lb

## 2010-10-22 DIAGNOSIS — J449 Chronic obstructive pulmonary disease, unspecified: Secondary | ICD-10-CM

## 2010-10-22 MED ORDER — CEFUROXIME AXETIL 500 MG PO TABS: 500.0000 mg | ORAL_TABLET | Freq: Two times a day (BID) | ORAL | Status: AC

## 2010-10-22 MED ORDER — PREDNISONE 10 MG PO TABS: ORAL_TABLET | ORAL | Status: AC

## 2010-10-22 NOTE — Assessment & Plan Note (Addendum)
Slow to resolve COPD Exacerbation  Xray pending  Plan ;  Prednisone taper over next week Fluids and rest  Brush/rinse/gargle after inhaler use.  If thrush does not resolve and you change your mind call us back for med.  If not improving or develop discolored mucus may use Ceftin 500mg  Twice daily  For 10 days  follow up Dr. Delton Coombes 4 weeks  Please contact office for sooner follow up if symptoms do not improve or worsen or seek emergency care

## 2010-10-22 NOTE — Patient Instructions (Addendum)
Prednisone taper over next week Fluids and rest  Brush/rinse/gargle after inhaler use.  If thrush does not resolve and you change your mind call us back for med.  If not improving or develop discolored mucus may use Ceftin 500mg  Twice daily  For 10 days  follow up Dr. Delton Coombes 4 weeks  Please contact office for sooner follow up if symptoms do not improve or worsen or seek emergency care

## 2010-10-22 NOTE — Progress Notes (Signed)
  Subjective:    Patient ID: Judy Gonzalez, female    DOB: 07/12/1945, 65 y.o.   MRN: [id number]  HPI 65 yo woman, former smoker (> 75 pk-yrs), dx with COPD around 2007 (has had PFT's at [facility]). Established care here 5/10. Has been on Symbicort + Spiriva, O2. Stopped smoking after our initial visit  ROV 07/19/10 -- COPD, doing well since last visit. Feels better since her viral gastroenteritis. Taking Spiriva and Symbicort. Rare ProAir use. Needs paperwork filled out for [facility] today so that she can drive with her O2 on.   ROV 10/05/10 -- COPD, managed on Spiriva + Symbicort. Seen 3/28 by Dr Delford Field for possible acute bronchitis, treated with pred taper and biaxin. Her breathing and cough are better, but she is still worried because she is having fever - T 98.6 - 99.2. She has been lethargic, aches.   10/22/2010 Acute OV Pt returns for persistent cough, congestion. Says she got better for several days but last week cough restarted. She says her normal temp is 96 but feels she is running fever with temp at 97. Previously dx of slow to resolve bronchitis 1 month ago tx with biaxin and steroid taper , then given doxycycline for slow to resolve congestion. She was unable to tolerant due to n/v. She does have sore throat and hoarseness. On exam today she has thrush, however she refuses rx. She wants to use garlic. Says she can not take mucinex due nausea.    Review of Systems  Constitutional: Negative for fever and fatigue.  HENT: Positive for congestion, sneezing and postnasal drip.   Respiratory: Positive for cough and shortness of breath. Negative for choking, wheezing and stridor.   Cardiovascular: Negative for chest pain and leg swelling.       Objective:   Physical Exam Gen: Pleasant, well-nourished, in no distress,  normal affect  ENT: No lesions, post pharynx with scattered white patches c/w thrush.   Neck: No JVD, no TMG, no carotid bruits  Lungs: Coarse BS w/ few faint exp  wheezes.    Cardiovascular: RRR, heart sounds normal, no murmur or gallops, no peripheral edema  Abdomen: soft and NT, no HSM,  BS normal  Musculoskeletal: No deformities, no cyanosis or clubbing  Neuro: alert, non focal  Skin: Warm, no lesions or rashes          Assessment & Plan:

## 2010-10-27 ENCOUNTER — Telehealth: Payer: Self-pay | Admitting: Emergency Medicine

## 2010-10-27 NOTE — Telephone Encounter (Signed)
Will forward to Dr. Delton Coombes as an fyi.

## 2010-10-27 NOTE — Telephone Encounter (Signed)
FYI only per pt (for nurse)- caller says she just spoke to [place] ?Marland Kitchen She says let her know that sats are now 98% on 2.5L "on exertion". No more fever. Pt is doing great. Also says that her toothpaste w/ xylotol "really opened up her sinus". You may want to recommend this to people. No call back needed.

## 2010-11-04 ENCOUNTER — Ambulatory Visit: Payer: Medicare Other | Admitting: Emergency Medicine

## 2010-11-09 NOTE — Op Note (Signed)
Judy Gonzalez, Judy Gonzalez             ACCOUNT NO.:  [id number]   MEDICAL RECORD NO.:  [id number]          PATIENT TYPE:  AMB   LOCATION:  DAY                          FACILITY:  [facility]   PHYSICIAN:  Velora Heckler, MD      DATE OF BIRTH:  09-30-1945   DATE OF PROCEDURE:  08/26/2008  DATE OF DISCHARGE:                               OPERATIVE REPORT   PREOPERATIVE DIAGNOSIS:  Primary hyperparathyroidism.   POSTOPERATIVE DIAGNOSIS:  Primary hyperparathyroidism.   PROCEDURE:  Right inferior parathyroidectomy.   SURGEON:  Velora Heckler, M.D., FACS   ASSISTANT:  Anselm Pancoast. Zachery Dakins, M.D., FACS   ANESTHESIA:  General.   ESTIMATED BLOOD LOSS:  Minimal.   PREPARATION:  Betadine.   COMPLICATIONS:  None.   INDICATIONS:  The patient is a 65 year old white female who presents  with biochemical evidence of primary hyperparathyroidism.  The patient  has undergone a workup by her endocrinologist in [place], [place].  She does have osteopenia by bone density scanning.  She had a  mildly elevated 24-hour urine collection for calcium.  Intact PTH level  was actually normal at 58.  Calcium was mildly elevated at 10.6.  Sestamibi scan performed at [facility] on May 15, 2008,  showed an area of abnormal activity at the inferior pole of the right  thyroid lobe, consistent with parathyroid adenoma.  Ultrasound was  performed in our office and documented a 1.2-cm mass posterior to the  inferior pole of the right lobe which was suspicious for parathyroid  adenoma.  The patient now comes to the operating room for minimally  invasive exploration.   BODY OF REPORT:  Procedure was done in OR #6 at the [facility].  The patient was brought to the operating room,  placed in a supine position on the operating room table.  Following  administration of general anesthesia, the patient is positioned and then  prepped and draped in the usual strict aseptic fashion.   After  ascertaining that an adequate level of anesthesia had been achieved, a  right neck incision was made a #15 blade.  Dissection was carried  through subcutaneous tissues.  Platysma was divided with the  electrocautery.  External jugular vein is divided between hemostats and  ligated with 2-0 silk ties.  Weitlaner retractor was placed for  exposure.  Strap muscles were incised in the midline and reflected to  the right.  The inferior pole of the thyroid gland is identified.  There  is a small nodule adjacent to the inferior pole of the thyroid.  This  appears to be either a lymph node or thyroid tissue.  It is gently  dissected out.  Vascular tributaries are divided between medium  hemoclips.  The nodule measures less than a centimeter in size and is  completely excised and submitted to pathology.  Frozen section confirms  thyroid tissue.   Further dissection posterior to the inferior pole of the thyroid reveals  an enlarged parathyroid gland.  This was gently dissected out.  Recurrent nerve was identified and preserved.  Vascular tributaries to  the parathyroid gland are divided between small hemoclips.  Using the  Harmonic scalpel, the parathyroid gland is separated from the thyroid  capsule.  The gland is completely excised and submitted to pathology.  Frozen section confirms parathyroid tissue, which appears hypercellular  and is consistent with parathyroid adenoma.   The neck is irrigated with warm saline.  Good hemostasis is noted.  Surgicel is placed in the operative field.  Strap muscles are  reapproximated in the midline with interrupted 3-0 Vicryl sutures.  Platysma was closed with interrupted 3-0 Vicryl sutures.  Skin was  anesthetized with local Marcaine anesthetic.  Skin edges were  reapproximated with a running 4-0 Monocryl subcuticular suture.  The  wound is washed and dried and Benzoin and Steri-Strips are applied.  Sterile dressings are applied.  The patient is  awakened from anesthesia  and brought to the recovery room in stable condition.  The patient  tolerated the procedure well.      Velora Heckler, MD  Electronically Signed     TMG/MEDQ  D:  08/26/2008  T:  08/27/2008  Job:  [id number]   cc:   Dr. Kenney Houseman   Dr. Jennet Maduro   Dr. Fanny Skates  [place], [place]

## 2010-11-15 ENCOUNTER — Encounter: Payer: Self-pay | Admitting: Emergency Medicine

## 2010-11-19 ENCOUNTER — Ambulatory Visit (INDEPENDENT_AMBULATORY_CARE_PROVIDER_SITE_OTHER): Payer: Medicare Other | Admitting: Emergency Medicine

## 2010-11-19 ENCOUNTER — Encounter: Payer: Self-pay | Admitting: Emergency Medicine

## 2010-11-19 VITALS — BP 130/82 | HR 99 | Temp 97.5°F | Ht 66.0 in | Wt 135.4 lb

## 2010-11-19 DIAGNOSIS — J449 Chronic obstructive pulmonary disease, unspecified: Secondary | ICD-10-CM

## 2010-11-19 NOTE — Progress Notes (Deleted)
  Subjective:    Patient ID: Judy Gonzalez, female    DOB: 07/09/1945, 65 y.o.   MRN: [id number]  HPI    Review of Systems     Objective:   Physical Exam        Assessment & Plan:

## 2010-11-19 NOTE — Patient Instructions (Signed)
We will continue your Spiriva or Symbicort.  Wear your oxygen at all times.  Follow up with Dr Delton Coombes in 4 months or as needed

## 2010-11-19 NOTE — Progress Notes (Signed)
  Subjective:    Patient ID: Judy Gonzalez, female    DOB: 1946/01/05, 65 y.o.   MRN: [id number]  HPI 65 yo woman, former smoker (> 75 pk-yrs), dx with COPD around 2007 (has had PFT's at [facility]). Established care here 5/10. Has been on Symbicort + Spiriva, O2. Stopped smoking after our initial visit  ROV 07/19/10 -- COPD, doing well since last visit. Feels better since her viral gastroenteritis. Taking Spiriva and Symbicort. Rare ProAir use. Needs paperwork filled out for [facility] today so that she can drive with her O2 on.   ROV 10/05/10 -- COPD, managed on Spiriva + Symbicort. Seen 3/28 by Dr Delford Field for possible acute bronchitis, treated with pred taper and biaxin. Her breathing and cough are better, but she is still worried because she is having fever - T 98.6 - 99.2. She has been lethargic, aches.   10/22/2010 Acute OV Pt returns for persistent cough, congestion. Says she got better for several days but last week cough restarted. She says her normal temp is 96 but feels she is running fever with temp at 97. Previously dx of slow to resolve bronchitis 1 month ago tx with biaxin and steroid taper , then given doxycycline for slow to resolve congestion. She was unable to tolerant due to n/v. She does have sore throat and hoarseness. On exam today she has thrush, however she refuses rx. She wants to use garlic. Says she can not take mucinex due nausea.   ROV 11/19/10 -- COPD. She was seen 4/27 and found to have thrush, seemed to be causing cough and dyspnea. She started using garlic again, used sugar free and glutin free diet, Na bicarb mouthwash. Worried today about the fact that many of the meds she is on (fomoterol, etc) can contribute to prolonged QTc, also worried about potential for Biaxin to do this - wants a different antibiotic if needed next time. Didn't take the pred and abx that were given last time.      Objective:   Physical Exam Gen: Pleasant, well-nourished, in no distress,  normal  affect  ENT: No lesions, post pharynx shows thrush has resolved.   Neck: No JVD, no TMG, no carotid bruits  Lungs: Coarse BS w/ few faint exp wheezes.    Cardiovascular: RRR, heart sounds normal, no murmur or gallops, no peripheral edema  Musculoskeletal: No deformities, no cyanosis or clubbing  Neuro: alert, non focal  Skin: Warm, no lesions or rashes   Assessment & Plan:

## 2010-11-26 NOTE — Assessment & Plan Note (Signed)
Same BD's O2

## 2011-02-03 ENCOUNTER — Telehealth: Payer: Self-pay | Admitting: Emergency Medicine

## 2011-02-03 NOTE — Telephone Encounter (Signed)
Pt aware samples at front. Jennifer Castillo, CMA  

## 2011-03-25 ENCOUNTER — Encounter: Payer: Self-pay | Admitting: Emergency Medicine

## 2011-03-25 ENCOUNTER — Ambulatory Visit (INDEPENDENT_AMBULATORY_CARE_PROVIDER_SITE_OTHER): Payer: Medicare Other | Admitting: Emergency Medicine

## 2011-03-25 ENCOUNTER — Other Ambulatory Visit: Payer: Self-pay | Admitting: Emergency Medicine

## 2011-03-25 VITALS — BP 132/72 | HR 99 | Temp 97.9°F | Ht 66.0 in | Wt 143.4 lb

## 2011-03-25 DIAGNOSIS — J449 Chronic obstructive pulmonary disease, unspecified: Secondary | ICD-10-CM

## 2011-03-25 NOTE — Assessment & Plan Note (Signed)
She has been stable on current regimen.  - continue same BD's - O2 as ordered - she doesn't want the flu shot - ROV 4 months or prn

## 2011-03-25 NOTE — Progress Notes (Signed)
  Subjective:    Patient ID: Judy Gonzalez, female    DOB: 1945-08-24, 65 y.o.   MRN: [id number]  HPI    Review of Systems     Objective:   Physical Exam        Assessment & Plan:  SATURATION QUALIFICATIONS:   Patient Saturations on Room Air while Ambulating = 82%  Patient Saturations on 2.5 Liters of oxygen while Ambulating = 94%

## 2011-03-25 NOTE — Progress Notes (Signed)
  Subjective:    Patient ID: Judy Gonzalez, female    DOB: [birth year], 65 y.o.   MRN: [id number]  HPI 65 yo woman, former smoker (> 75 pk-yrs), dx with COPD around 2007 (has had PFT's at [facility]). Established care here 5/10. Has been on Symbicort + Spiriva, O2. Stopped smoking after our initial visit  ROV 07/19/10 -- COPD, doing well since last visit. Feels better since her viral gastroenteritis. Taking Spiriva and Symbicort. Rare ProAir use. Needs paperwork filled out for [facility] today so that she can drive with her O2 on.   ROV 10/05/10 -- COPD, managed on Spiriva + Symbicort. Seen 3/28 by Dr Delford Field for possible acute bronchitis, treated with pred taper and biaxin. Her breathing and cough are better, but she is still worried because she is having fever - T 98.6 - 99.2. She has been lethargic, aches.   10/22/2010 Acute OV Pt returns for persistent cough, congestion. Says she got better for several days but last week cough restarted. She says her normal temp is 96 but feels she is running fever with temp at 97. Previously dx of slow to resolve bronchitis 1 month ago tx with biaxin and steroid taper , then given doxycycline for slow to resolve congestion. She was unable to tolerant due to n/v. She does have sore throat and hoarseness. On exam today she has thrush, however she refuses rx. She wants to use garlic. Says she can not take mucinex due nausea.   ROV 11/19/10 -- COPD. She was seen 4/27 and found to have thrush, seemed to be causing cough and dyspnea. She started using garlic again, used sugar free and glutin free diet, Na bicarb mouthwash. Worried today about the fact that many of the meds she is on (fomoterol, etc) can contribute to prolonged QTc, also worried about potential for Biaxin to do this - wants a different antibiotic if needed next time. Didn't take the pred and abx that were given last time.   ROV 03/25/11 -- COPD, on Spiriva + Symbicort. She has done well thru the Summer, no  exacerbations. She doesn't get the flu shot. She has been on 2.5L/min, sleeps w it as well. She makes it clear today that her son, Judy Gonzalez, is to be her HCPOA [phone], [phone].      Objective:  Physical Exam Gen: Pleasant, well-nourished, in no distress,  normal affect  ENT: No lesions, post pharynx shows thrush has resolved.   Neck: No JVD, no TMG, no carotid bruits  Lungs: No wheezes, good air movement.   Cardiovascular: RRR, heart sounds normal, no murmur or gallops, no peripheral edema  Musculoskeletal: No deformities, no cyanosis or clubbing  Neuro: alert, non focal  Skin: Warm, no lesions or rashes   Assessment & Plan:

## 2011-03-25 NOTE — Patient Instructions (Signed)
Please continue your Spiriva and Symbicort Wear your oxygen as you have been at 2.5L/min Follow up with Dr Delton Coombes in 4 months or sooner if you have any problems.

## 2011-03-28 ENCOUNTER — Encounter: Payer: Self-pay | Admitting: *Deleted

## 2011-03-28 ENCOUNTER — Telehealth: Payer: Self-pay | Admitting: Emergency Medicine

## 2011-03-28 NOTE — Telephone Encounter (Signed)
A copy of the original jury summons has been sent to the pt's home address which was verified with the patient.

## 2011-06-15 ENCOUNTER — Other Ambulatory Visit: Payer: Self-pay | Admitting: Emergency Medicine

## 2011-07-11 ENCOUNTER — Ambulatory Visit: Payer: Medicare Other | Admitting: Emergency Medicine

## 2011-09-09 ENCOUNTER — Ambulatory Visit (INDEPENDENT_AMBULATORY_CARE_PROVIDER_SITE_OTHER): Payer: Medicare Other | Admitting: Emergency Medicine

## 2011-09-09 ENCOUNTER — Encounter: Payer: Self-pay | Admitting: Emergency Medicine

## 2011-09-09 VITALS — BP 122/76 | HR 118 | Temp 97.6°F | Ht 66.0 in | Wt 137.0 lb

## 2011-09-09 DIAGNOSIS — J449 Chronic obstructive pulmonary disease, unspecified: Secondary | ICD-10-CM

## 2011-09-09 NOTE — Progress Notes (Signed)
Subjective:    Patient ID: Judy Gonzalez, female    DOB: 06-29-1945, 66 y.o.   MRN: [id number]  HPI 66 yo woman, former smoker (> 75 pk-yrs), dx with COPD around 2007 (has had PFT's at [facility]). Established care here 5/10. Has been on Symbicort + Spiriva, O2. Stopped smoking after our initial visit  ROV 07/19/10 -- COPD, doing well since last visit. Feels better since her viral gastroenteritis. Taking Spiriva and Symbicort. Rare ProAir use. Needs paperwork filled out for [facility] today so that she can drive with her O2 on.   ROV 10/05/10 -- COPD, managed on Spiriva + Symbicort. Seen 3/28 by Dr Delford Field for possible acute bronchitis, treated with pred taper and biaxin. Her breathing and cough are better, but she is still worried because she is having fever - T 98.6 - 99.2. She has been lethargic, aches.   10/22/2010 Acute OV Pt returns for persistent cough, congestion. Says she got better for several days but last week cough restarted. She says her normal temp is 96 but feels she is running fever with temp at 97. Previously dx of slow to resolve bronchitis 1 month ago tx with biaxin and steroid taper , then given doxycycline for slow to resolve congestion. She was unable to tolerant due to n/v. She does have sore throat and hoarseness. On exam today she has thrush, however she refuses rx. She wants to use garlic. Says she can not take mucinex due nausea.   ROV 11/19/10 -- COPD. She was seen 4/27 and found to have thrush, seemed to be causing cough and dyspnea. She started using garlic again, used sugar free and glutin free diet, Na bicarb mouthwash. Worried today about the fact that many of the meds she is on (fomoterol, etc) can contribute to prolonged QTc, also worried about potential for Biaxin to do this - wants a different antibiotic if needed next time. Didn't take the pred and abx that were given last time.   ROV 03/25/11 -- COPD, on Spiriva + Symbicort. She has done well thru the Summer, no  exacerbations. She doesn't get the flu shot. She has been on 2.5L/min, sleeps w it as well. She makes it clear today that her son, Judy Gonzalez, is to be her HCPOA [phone], [phone].   ROV 09/09/11 -- COPD, significant AFL, presents for f/u. She has not been doing as well through January and February, less active. She was worried that she got a faulty Symbicort - wasn't emptying properly, wasn';t getting the med. She has been more anxious, worried about lots of external factors - ? Whether there is a component of panic here. No cough, wheezing. She got a new symbicort and feels better. She has turned her O2 to 3L/min.      Objective:  Physical Exam Gen: Pleasant, well-nourished, in mild distress, more anxious and panicked today  ENT: No lesions, post pharynx shows no lesions  Neck: No JVD, no TMG, no carotid bruits  Lungs: No wheezes, good air movement.   Cardiovascular: regular, tachy, heart sounds normal, no murmur or gallops, no peripheral edema  Musculoskeletal: No deformities, no cyanosis or clubbing  Neuro: alert, non focal  Skin: Warm, no lesions or rashes   Assessment & Plan:  COPD Experienced a decline last couple months that doesn;t really sound like an exacerbation - she questions whether her symbicort wasn;t working which is certaiinly possible. ? Whether there was a component of depression + anxiety. Breathing seems to be better now.  -  same BDs - rov 4 months

## 2011-09-09 NOTE — Assessment & Plan Note (Signed)
Experienced a decline last couple months that doesn;t really sound like an exacerbation - she questions whether her symbicort wasn;t working which is certaiinly possible. ? Whether there was a component of depression + anxiety. Breathing seems to be better now.  - same BDs - rov 4 months

## 2011-09-09 NOTE — Patient Instructions (Signed)
Please staty on your Spiriva and Symbicort Increase your oxygen to 3L/min  Follow with Dr Delton Coombes in 4 months or sooner if you have any problems.

## 2011-10-28 ENCOUNTER — Encounter: Payer: Self-pay | Admitting: Emergency Medicine

## 2011-10-28 ENCOUNTER — Ambulatory Visit (INDEPENDENT_AMBULATORY_CARE_PROVIDER_SITE_OTHER): Payer: Medicare Other | Admitting: Emergency Medicine

## 2011-10-28 VITALS — HR 106 | Temp 98.0°F | Ht 66.0 in | Wt 136.4 lb

## 2011-10-28 DIAGNOSIS — J449 Chronic obstructive pulmonary disease, unspecified: Secondary | ICD-10-CM

## 2011-10-28 NOTE — Assessment & Plan Note (Signed)
Currently stable - same meds

## 2011-10-28 NOTE — Patient Instructions (Signed)
Please continue your Symbicort and Spiriva Wear your oxygen at all times.  Follow with Dr Delton Coombes in 3 months

## 2011-10-28 NOTE — Progress Notes (Signed)
  Subjective:    Patient ID: Judy Gonzalez, female    DOB: Sep 23, 1945, 66 y.o.   MRN: [id number]  HPI 66 yo woman, former smoker (> 75 pk-yrs), dx with COPD around 2007 (has had PFT's at [facility]). Established care here 5/10. Has been on Symbicort + Spiriva, O2. Stopped smoking after our initial visit  ROV 11/19/10 -- COPD. She was seen 4/27 and found to have thrush, seemed to be causing cough and dyspnea. She started using garlic again, used sugar free and glutin free diet, Na bicarb mouthwash. Worried today about the fact that many of the meds she is on (fomoterol, etc) can contribute to prolonged QTc, also worried about potential for Biaxin to do this - wants a different antibiotic if needed next time. Didn't take the pred and abx that were given last time.   ROV 03/25/11 -- COPD, on Spiriva + Symbicort. She has done well thru the Summer, no exacerbations. She doesn't get the flu shot. She has been on 2.5L/min, sleeps w it as well. She makes it clear today that her son, Harveen Flesch II, is to be her HCPOA [phone], [phone].   ROV 09/09/11 -- COPD, significant AFL, presents for f/u. She has not been doing as well through January and February, less active. She was worried that she got a faulty Symbicort - wasn't emptying properly, wasn';t getting the med. She has been more anxious, worried about lots of external factors - ? Whether there is a component of panic here. No cough, wheezing. She got a new symbicort and feels better. She has turned her O2 to 3L/min.   ROV 10/28/11 -- COPD, significant AFL, presents for f/u. She has been maintained on Spiriva + Symbicort, rare SABA use. She is wearing her oxygen at all times.  Needs to have her driving permission form completed today.      Objective:  Physical Exam Gen: Pleasant, well-nourished, in mild distress, more anxious and panicked today  ENT: No lesions, post pharynx shows no lesions  Neck: No JVD, no TMG, no carotid  bruits  Lungs: No wheezes, good air movement.   Cardiovascular: regular, tachy, heart sounds normal, no murmur or gallops, no peripheral edema  Musculoskeletal: No deformities, no cyanosis or clubbing  Neuro: alert, non focal  Skin: Warm, no lesions or rashes   Assessment & Plan:  COPD Currently stable - same meds

## 2011-10-31 ENCOUNTER — Telehealth: Payer: Self-pay | Admitting: Emergency Medicine

## 2011-10-31 NOTE — Telephone Encounter (Signed)
I spoke with pt and she states that RB had forgot to fill out a part on her form her filled out on 10/28/11 and wants to bring this by so he can do so and wants it done while she is here. i spoke with lori and she stated to tell pt to be here about 1:20 this afternoon. Pt is aware and nothing further was needed

## 2011-11-14 ENCOUNTER — Telehealth (INDEPENDENT_AMBULATORY_CARE_PROVIDER_SITE_OTHER): Payer: Self-pay

## 2011-11-14 NOTE — Telephone Encounter (Signed)
Pt called stating her PCP Dr Jari Favre is working up her irregular heart rate. Pt states she had this problem before when she was being worked up for her parathyroid several years ago. Pt wanted to know the different labs that her PCP could draw to indicate if she may have problems with her parathyroid. I advised her we usually draw at intact PTH, 25 hydroxy vit d level and a calcium. Pt states she will ask her PCP to draw these and if she has any problems she will call back for Dr Ardine Eng advice.

## 2012-01-30 ENCOUNTER — Encounter: Payer: Self-pay | Admitting: Emergency Medicine

## 2012-01-30 ENCOUNTER — Ambulatory Visit (INDEPENDENT_AMBULATORY_CARE_PROVIDER_SITE_OTHER): Payer: Medicare Other | Admitting: Emergency Medicine

## 2012-01-30 VITALS — BP 138/78 | HR 89 | Temp 97.8°F | Ht 67.0 in | Wt 136.0 lb

## 2012-01-30 DIAGNOSIS — J449 Chronic obstructive pulmonary disease, unspecified: Secondary | ICD-10-CM

## 2012-01-30 MED ORDER — TIOTROPIUM BROMIDE MONOHYDRATE 18 MCG IN CAPS: 1.0000 | ORAL_CAPSULE | Freq: Every day | RESPIRATORY_TRACT | Status: DC

## 2012-01-30 MED ORDER — BUDESONIDE-FORMOTEROL FUMARATE 160-4.5 MCG/ACT IN AERO: 2.0000 | INHALATION_SPRAY | Freq: Two times a day (BID) | RESPIRATORY_TRACT | Status: DC

## 2012-01-30 NOTE — Assessment & Plan Note (Signed)
Stable on Spiriva + Symbicort.  - continue same  - o2 at 3L/min - rov 3 mon

## 2012-01-30 NOTE — Progress Notes (Signed)
  Subjective:    Patient ID: Judy Gonzalez, female    DOB: 1946-06-20, 66 y.o.   MRN: [id number]  HPI 66 yo woman, former smoker (> 75 pk-yrs), dx with COPD around 2007 (has had PFT's at [facility]). Established care here 5/10. Has been on Symbicort + Spiriva, O2. Stopped smoking after our initial visit  ROV 11/19/10 -- COPD. She was seen 4/27 and found to have thrush, seemed to be causing cough and dyspnea. She started using garlic again, used sugar free and glutin free diet, Na bicarb mouthwash. Worried today about the fact that many of the meds she is on (fomoterol, etc) can contribute to prolonged QTc, also worried about potential for Biaxin to do this - wants a different antibiotic if needed next time. Didn't take the pred and abx that were given last time.   ROV 03/25/11 -- COPD, on Spiriva + Symbicort. She has done well thru the Summer, no exacerbations. She doesn't get the flu shot. She has been on 2.5L/min, sleeps w it as well. She makes it clear today that her son, Verleen Stuckey II, is to be her HCPOA [phone], [phone].   ROV 09/09/11 -- COPD, significant AFL, presents for f/u. She has not been doing as well through January and February, less active. She was worried that she got a faulty Symbicort - wasn't emptying properly, wasn';t getting the med. She has been more anxious, worried about lots of external factors - ? Whether there is a component of panic here. No cough, wheezing. She got a new symbicort and feels better. She has turned her O2 to 3L/min.   ROV 10/28/11 -- COPD, significant AFL, presents for f/u. She has been maintained on Spiriva + Symbicort, rare SABA use. She is wearing her oxygen at all times.  Needs to have her driving permission form completed today.   ROV 01/30/12 -- COPD, significant AFL, presents for f/u. She tells me that she has had some tachycardia, joint pains. She had a parathyroid w/u that revealed normal PTH etc.      Objective:  Physical  Exam Filed Vitals:   01/30/12 1335  BP: 138/78  Pulse: 89  Temp: 97.8 F (36.6 C)   Gen: Pleasant, well-nourished, no distress  ENT: No lesions, post pharynx shows no lesions  Neck: No JVD, no TMG, no carotid bruits  Lungs: No wheezes, good air movement.   Cardiovascular: regular, tachy, heart sounds normal, no murmur or gallops, no peripheral edema  Musculoskeletal: No deformities, no cyanosis or clubbing  Neuro: alert, non focal  Skin: Warm, no lesions or rashes   Assessment & Plan:  COPD Stable on Spiriva + Symbicort.  - continue same  - o2 at 3L/min - rov 3 mon

## 2012-01-30 NOTE — Patient Instructions (Addendum)
Please continue your Spiriva and Symbicort as you are taking them Use your albuterol as needed Wear your oxygen at 3L/min Follow with Dr Delton Coombes in 3 months or sooner if you have any problems.

## 2012-01-30 NOTE — Addendum Note (Signed)
Addended by: Nita Sells on: 01/30/2012 02:10 PM   Modules accepted: Orders

## 2012-04-30 ENCOUNTER — Ambulatory Visit (INDEPENDENT_AMBULATORY_CARE_PROVIDER_SITE_OTHER): Payer: Medicare Other | Admitting: Emergency Medicine

## 2012-04-30 ENCOUNTER — Encounter: Payer: Self-pay | Admitting: Emergency Medicine

## 2012-04-30 VITALS — BP 150/82 | HR 112 | Temp 98.1°F | Ht 67.0 in | Wt 136.5 lb

## 2012-04-30 DIAGNOSIS — J449 Chronic obstructive pulmonary disease, unspecified: Secondary | ICD-10-CM

## 2012-04-30 NOTE — Progress Notes (Signed)
  Subjective:  Patient ID: Judy Gonzalez, female    DOB: May 20, 1946, 66 y.o.   MRN: [id number] HPI 66 yo woman, former smoker (> 75 pk-yrs), dx with COPD around 2007 (has had PFT's at [facility]). Established care here 5/10. Has been on Symbicort + Spiriva, O2. Stopped smoking after our initial visit  ROV 11/19/10 -- COPD. She was seen 4/27 and found to have thrush, seemed to be causing cough and dyspnea. She started using garlic again, used sugar free and glutin free diet, Na bicarb mouthwash. Worried today about the fact that many of the meds she is on (fomoterol, etc) can contribute to prolonged QTc, also worried about potential for Biaxin to do this - wants a different antibiotic if needed next time. Didn't take the pred and abx that were given last time.   ROV 03/25/11 -- COPD, on Spiriva + Symbicort. She has done well thru the Summer, no exacerbations. She doesn't get the flu shot. She has been on 2.5L/min, sleeps w it as well. She makes it clear today that her son, Judy Gonzalez, is to be her HCPOA [phone], [phone].   ROV 09/09/11 -- COPD, significant AFL, presents for f/u. She has not been doing as well through January and February, less active. She was worried that she got a faulty Symbicort - wasn't emptying properly, wasn';t getting the med. She has been more anxious, worried about lots of external factors - ? Whether there is a component of panic here. No cough, wheezing. She got a new symbicort and feels better. She has turned her O2 to 3L/min.   ROV 10/28/11 -- COPD, significant AFL, presents for f/u. She has been maintained on Spiriva + Symbicort, rare SABA use. She is wearing her oxygen at all times.  Needs to have her driving permission form completed today.   ROV 01/30/12 -- COPD, significant AFL, presents for f/u. She tells me that she has had some tachycardia, joint pains. She had a parathyroid w/u that revealed normal PTH etc.   ROV 04/30/12 -- COPD, significant AFL,  presents for f/u. She returns today reporting that she stopped Spiriva about 5 days ago due to tachycardia, some L arm pain. She has noticed sleep disturbance with awakenings, elevated BP, has been concerned that she is hyperparathyroid....  She feels that her tachycardia may be better. She doesn't seem that her breathing has worsened with the change. She rarely uses ProAir.      Objective:  Physical Exam Filed Vitals:   04/30/12 1353  BP: 150/82  Pulse: 112  Temp: 98.1 F (36.7 C)   Gen: Very pressured and tangential today, admits to feeling some agitation  ENT: No lesions, post pharynx shows no lesions  Neck: No JVD, no TMG, no carotid bruits  Lungs: No wheezes, good air movement.   Cardiovascular: regular, tachy, heart sounds normal, no murmur or gallops, no peripheral edema  Musculoskeletal: No deformities, no cyanosis or clubbing  Neuro: alert, non focal  Skin: Warm, no lesions or rashes   Assessment & Plan:  COPD She is convinced that Spiriva has contributed to her tachycardia and her agitation -certainly could be a contributor - will continue the symbicort - hold off on spiriva for now pending her metabolic workup by Dr Katrinka Blazing.  - Follow with Dr Delton Coombes in 4 months or sooner if you have any problems.

## 2012-04-30 NOTE — Patient Instructions (Signed)
Please continue your Symbicort  Stay off Spiriva for now - we may revisit using this medication at some point if you lose ground with your breathing Wear your oxygen Follow with Dr Delton Coombes in 4 months or sooner if you have any problems.

## 2012-04-30 NOTE — Assessment & Plan Note (Signed)
She is convinced that Spiriva has contributed to her tachycardia and her agitation -certainly could be a contributor - will continue the symbicort - hold off on spiriva for now pending her metabolic workup by Dr Katrinka Blazing.  - Follow with Dr Delton Coombes in 4 months or sooner if you have any problems.

## 2012-05-16 ENCOUNTER — Telehealth: Payer: Self-pay | Admitting: Emergency Medicine

## 2012-05-16 NOTE — Telephone Encounter (Signed)
I spoke with pt and she stated she is needing a letter from RB for her lawyer. She is in current mind set (know what she is doing). She is changing her will. This is to stop people from being able to stop her from this. Would like this on letter head. Pt would like letter mailed to her and would like this before she goes to see her Lawyer on 05/23/12 at 9 am. Please advise RB thanks

## 2012-05-16 NOTE — Telephone Encounter (Signed)
Capacity to make your medical and legal decisions is actually a formal evaluation that is made (and documented, so she could probably take to her attorney) by a psychiatrist. I can't write it for her.

## 2012-05-16 NOTE — Telephone Encounter (Signed)
Spoke with patient-aware of recs from RB and will give to her PCP to write instead.

## 2012-05-23 ENCOUNTER — Telehealth (INDEPENDENT_AMBULATORY_CARE_PROVIDER_SITE_OTHER): Payer: Self-pay

## 2012-05-23 NOTE — Telephone Encounter (Signed)
I LMOM to call me back. Trying to return pts call on phone # she left . Msg did identify it was her phone.  Her msg from Denny Peon stated it was not an emergency just wanted to talk about some difficulties she was having.

## 2012-05-28 ENCOUNTER — Telehealth (INDEPENDENT_AMBULATORY_CARE_PROVIDER_SITE_OTHER): Payer: Self-pay

## 2012-05-28 NOTE — Telephone Encounter (Signed)
Pt called stating she is seeing Dr Judy Gonzalez a new endocrine MD at [facility] today for new endocrine work up. She just wanted Dr Gerrit Friends to be aware. Pt states she will have records sent to Dr Gerrit Friends for review.

## 2012-08-11 ENCOUNTER — Other Ambulatory Visit: Payer: Self-pay

## 2013-01-30 ENCOUNTER — Other Ambulatory Visit: Payer: Self-pay

## 2013-03-22 ENCOUNTER — Ambulatory Visit (INDEPENDENT_AMBULATORY_CARE_PROVIDER_SITE_OTHER): Payer: Medicare Other | Admitting: Emergency Medicine

## 2013-03-22 ENCOUNTER — Encounter: Payer: Self-pay | Admitting: Emergency Medicine

## 2013-03-22 VITALS — BP 150/80 | HR 93 | Temp 98.3°F | Ht 66.0 in | Wt 105.4 lb

## 2013-03-22 DIAGNOSIS — J449 Chronic obstructive pulmonary disease, unspecified: Secondary | ICD-10-CM

## 2013-03-22 NOTE — Progress Notes (Signed)
  Subjective:  Patient ID: Judy Gonzalez, female    DOB: [birth year], 67 y.o.   MRN: [id number] HPI 67 yo woman, former smoker (> 75 pk-yrs), dx with COPD around 2007 (has had PFT's at [facility]). Established care here 5/10. Has been on Symbicort + Spiriva, O2. Stopped smoking after our initial visit  ROV 11/19/10 -- COPD. She was seen 68/27 and found to have thrush, seemed to be causing cough and dyspnea. She started using garlic again, used sugar free and glutin free diet, Na bicarb mouthwash. Worried today about the fact that many of the meds she is on (fomoterol, etc) can contribute to prolonged QTc, also worried about potential for Biaxin to do this - wants a different antibiotic if needed next time. Didn't take the pred and abx that were given last time.   ROV 03/25/11 -- COPD, on Spiriva + Symbicort. She has done well thru the Summer, no exacerbations. She doesn't get the flu shot. She has been on 2.5L/min, sleeps w it as well. She makes it clear today that her son, Judy Gonzalez, is to be her HCPOA [phone], [phone].   ROV 09/09/11 -- COPD, significant AFL, presents for f/u. She has not been doing as well through January and February, less active. She was worried that she got a faulty Symbicort - wasn't emptying properly, wasn';t getting the med. She has been more anxious, worried about lots of external factors - ? Whether there is a component of panic here. No cough, wheezing. She got a new symbicort and feels better. She has turned her O2 to 3L/min.   ROV 10/28/11 -- COPD, significant AFL, presents for f/u. She has been maintained on Spiriva + Symbicort, rare SABA use. She is wearing her oxygen at all times.  Needs to have her driving permission form completed today.   ROV 01/30/12 -- COPD, significant AFL, presents for f/u. She tells me that she has had some tachycardia, joint pains. She had a parathyroid w/u that revealed normal PTH etc.   ROV 04/30/12 -- COPD, significant AFL,  presents for f/u. She returns today reporting that she stopped Spiriva about 5 days ago due to tachycardia, some L arm pain. She has noticed sleep disturbance with awakenings, elevated BP, has been concerned that she is hyperparathyroid....  She feels that her tachycardia may be better. She doesn't seem that her breathing has worsened with the change. She rarely uses ProAir.   ROV 03/22/13 -- COPD, significant AFL. She remains on Symbicort, has done well on it. She very rarely uses ProAir. She remains active, no longer drives. Some cough morning and evening. No wheezing.      Objective:  Physical Exam Filed Vitals:   03/22/13 1417  BP: 150/80  Pulse: 93  Temp: 98.3 F (36.8 C)   Gen: Somewhat pressured and tangential today, but redirectable. Good spirits  ENT: No lesions, post pharynx shows no lesions  Neck: No JVD, no TMG, no carotid bruits  Lungs: No wheezes, good air movement.   Cardiovascular: regular, tachy, heart sounds normal, no murmur or gallops, no peripheral edema  Musculoskeletal: No deformities, no cyanosis or clubbing  Neuro: alert, non focal  Skin: Warm, no lesions or rashes   Assessment & Plan:  COPD Appears to be stable, rare SABA use. No exacerbations since last time.  - will continue symbicort + SABA prn.  - rov 6

## 2013-03-22 NOTE — Patient Instructions (Addendum)
Please continue your Symbicort twice a day Use ProAir when you need it.  Please call our office if you have any breathing problems Follow with Dr Delton Coombes in 6 months or sooner if you have any problems

## 2013-03-22 NOTE — Assessment & Plan Note (Addendum)
Appears to be stable, rare SABA use. No exacerbations since last time.  - will continue symbicort + SABA prn.  - rov 6

## 2013-03-25 ENCOUNTER — Telehealth: Payer: Self-pay | Admitting: Emergency Medicine

## 2013-03-25 MED ORDER — BUDESONIDE-FORMOTEROL FUMARATE 160-4.5 MCG/ACT IN AERO: 2.0000 | INHALATION_SPRAY | Freq: Two times a day (BID) | RESPIRATORY_TRACT | Status: AC

## 2013-03-25 MED ORDER — ALBUTEROL SULFATE HFA 108 (90 BASE) MCG/ACT IN AERS: 2.0000 | INHALATION_SPRAY | Freq: Four times a day (QID) | RESPIRATORY_TRACT | Status: AC | PRN

## 2013-03-25 NOTE — Telephone Encounter (Signed)
I spoke with pt and is aware RX has been called in. Nothing further needed

## 2013-10-29 ENCOUNTER — Telehealth: Payer: Self-pay | Admitting: Emergency Medicine

## 2013-10-29 NOTE — Telephone Encounter (Signed)
Spoke with pt's POA, Judy Gonzalez. States that the pt has been having lots of issues with her breathing. Has been taking Xanax 0.5mg  1 BID to help with anxiety when she can't breathe. Last OV was in 02/2013. Advised Judy Gonzalez that RB isn't the original prescribe on this medication. PCP would probably need to do this. Judy Gonzalez states that pt doesn't have a PCP right now. Pt will not get out of bed to go to a new PCP. She knows that the pt would come see RB. ROV has been scheduled for pt to evaluated on 11/15/13 at 11:15am.  RB - please advise on Xanax prescription. Thanks.

## 2013-10-30 NOTE — Telephone Encounter (Signed)
It is ok with me to prescribe this for her

## 2013-10-30 NOTE — Telephone Encounter (Signed)
Per RB ok for 1 time rx for xanax 0.5 # 30. Have called this into [facility] and made pt's POA Sheryl

## 2013-11-15 ENCOUNTER — Ambulatory Visit (INDEPENDENT_AMBULATORY_CARE_PROVIDER_SITE_OTHER): Payer: Medicare Other | Admitting: Emergency Medicine

## 2013-11-15 ENCOUNTER — Telehealth: Payer: Self-pay | Admitting: Emergency Medicine

## 2013-11-15 ENCOUNTER — Encounter: Payer: Self-pay | Admitting: Emergency Medicine

## 2013-11-15 VITALS — BP 118/70 | HR 86 | Ht 66.0 in | Wt 91.4 lb

## 2013-11-15 DIAGNOSIS — J449 Chronic obstructive pulmonary disease, unspecified: Secondary | ICD-10-CM

## 2013-11-15 MED ORDER — ALPRAZOLAM 0.5 MG PO TABS
1.0000 mg | ORAL_TABLET | Freq: Two times a day (BID) | ORAL | Status: DC
Start: 2013-11-15 — End: 2014-01-19

## 2013-11-15 NOTE — Telephone Encounter (Signed)
Spoke with Roe Coombs at [facility] and advised it's 1 tab bid instead of 2 bid (xanax) he verbalized understanding and nothing further is needed

## 2013-11-15 NOTE — Patient Instructions (Signed)
Please continue your Symbicort and xanax as ordered Use your oxygen at all times.  Follow with Dr Delton Coombes in 6 months or sooner if you have any problems

## 2013-11-15 NOTE — Assessment & Plan Note (Signed)
Acutely stable but there has been an overall decline in her health over the last few yrs. Weight loss and now needing help with some ADLs. Needs her xanax. We discussed today possible HH resources, possible hospice. She wants to consider the options.

## 2013-11-15 NOTE — Telephone Encounter (Signed)
Spoke with Roe Coombs, pharmacist at [facility]  He is calling for clarification on alprazolam rx  Pt normally takes 1 tab bid  Rx written for 2 tabs bid  The # 60 will only last 15 days if she takes this way  Please advise thanks!

## 2013-11-15 NOTE — Progress Notes (Signed)
Subjective:  Patient ID: Judy Gonzalez, female    DOB: [birth year], 68 y.o.   MRN: [id number] HPI 68 yo woman, former smoker (> 75 pk-yrs), dx with COPD around 2007 (has had PFT's at [facility]). Established care here 5/10. Has been on Symbicort + Spiriva, O2. Stopped smoking after our initial visit  ROV 11/19/10 -- COPD. She was seen 4/27 and found to have thrush, seemed to be causing cough and dyspnea. She started using garlic again, used sugar free and glutin free diet, Na bicarb mouthwash. Worried today about the fact that many of the meds she is on (fomoterol, etc) can contribute to prolonged QTc, also worried about potential for Biaxin to do this - wants a different antibiotic if needed next time. Didn't take the pred and abx that were given last time.   ROV 03/25/11 -- COPD, on Spiriva + Symbicort. She has done well thru the Summer, no exacerbations. She doesn't get the flu shot. She has been on 2.5L/min, sleeps w it as well. She makes it clear today that her son, Judy Gonzalez, is to be her HCPOA [phone], [phone].   ROV 09/09/11 -- COPD, significant AFL, presents for f/u. She has not been doing as well through January and February, less active. She was worried that she got a faulty Symbicort - wasn't emptying properly, wasn';t getting the med. She has been more anxious, worried about lots of external factors - ? Whether there is a component of panic here. No cough, wheezing. She got a new symbicort and feels better. She has turned her O2 to 3L/min.   ROV 10/28/11 -- COPD, significant AFL, presents for f/u. She has been maintained on Spiriva + Symbicort, rare SABA use. She is wearing her oxygen at all times.  Needs to have her driving permission form completed today.   ROV 01/30/12 -- COPD, significant AFL, presents for f/u. She tells me that she has had some tachycardia, joint pains. She had a parathyroid w/u that revealed normal PTH etc.   ROV 04/30/12 -- COPD, significant AFL,  presents for f/u. She returns today reporting that she stopped Spiriva about 5 days ago due to tachycardia, some L arm pain. She has noticed sleep disturbance with awakenings, elevated BP, has been concerned that she is hyperparathyroid....  She feels that her tachycardia may be better. She doesn't seem that her breathing has worsened with the change. She rarely uses ProAir.   ROV 03/22/13 -- COPD, significant AFL. She remains on Symbicort, has done well on it. She very rarely uses ProAir. She remains active, no longer drives. Some cough morning and evening. No wheezing.   ROV 11/15/13 -- COPD, significant AFL. She is on gluten free and rice free diet. Her breathing has done well on Symbicort. No flares. She benefits from xanax bid, has an anxiety component.  She has lost weight and has functionally declined over the last few yrs       Objective:  Physical Exam Filed Vitals:   11/15/13 1122  BP: 118/70  Pulse: 86  Height: 5\' 6"  (1.676 m)  Weight: 91 lb 6.4 oz (41.459 kg)  SpO2: 97%   Gen: Thin woman, has clearly lost wt, Good spirits  ENT: No lesions, post pharynx shows no lesions  Neck: No JVD, no TMG, no carotid bruits  Lungs: No wheezes, good air movement.   Cardiovascular: regular, tachy, heart sounds normal, no murmur or gallops, no peripheral edema  Musculoskeletal: No deformities, no cyanosis or clubbing  Neuro: alert,  non focal  Skin: Warm, no lesions or rashes   Assessment & Plan:  COPD Acutely stable but there has been an overall decline in her health over the last few yrs. Weight loss and now needing help with some ADLs. Needs her xanax. We discussed today possible HH resources, possible hospice. She wants to consider the options.

## 2013-11-15 NOTE — Telephone Encounter (Signed)
Pharmacy closed for lunch will call back after 2:30

## 2014-01-19 ENCOUNTER — Encounter (HOSPITAL_COMMUNITY): Payer: Self-pay | Admitting: Emergency Medicine

## 2014-01-19 ENCOUNTER — Emergency Department (HOSPITAL_COMMUNITY): Payer: Medicare Other

## 2014-01-19 ENCOUNTER — Inpatient Hospital Stay (HOSPITAL_COMMUNITY)
Admission: EM | Admit: 2014-01-19 | Discharge: 2014-01-23 | DRG: 189 | Disposition: A | Discharge status: Home / Self Care | Payer: Medicare Other | Attending: Internal Medicine | Admitting: Internal Medicine

## 2014-01-19 DIAGNOSIS — R64 Cachexia: Secondary | ICD-10-CM | POA: Diagnosis present

## 2014-01-19 DIAGNOSIS — Z87891 Personal history of nicotine dependence: Secondary | ICD-10-CM

## 2014-01-19 DIAGNOSIS — Z681 Body mass index (BMI) 19 or less, adult: Secondary | ICD-10-CM | POA: Diagnosis not present

## 2014-01-19 DIAGNOSIS — J9620 Acute and chronic respiratory failure, unspecified whether with hypoxia or hypercapnia: Secondary | ICD-10-CM | POA: Diagnosis present

## 2014-01-19 DIAGNOSIS — Z9981 Dependence on supplemental oxygen: Secondary | ICD-10-CM | POA: Diagnosis not present

## 2014-01-19 DIAGNOSIS — E210 Primary hyperparathyroidism: Secondary | ICD-10-CM

## 2014-01-19 DIAGNOSIS — J45901 Unspecified asthma with (acute) exacerbation: Secondary | ICD-10-CM

## 2014-01-19 DIAGNOSIS — Z8249 Family history of ischemic heart disease and other diseases of the circulatory system: Secondary | ICD-10-CM

## 2014-01-19 DIAGNOSIS — R0600 Dyspnea, unspecified: Secondary | ICD-10-CM

## 2014-01-19 DIAGNOSIS — M949 Disorder of cartilage, unspecified: Secondary | ICD-10-CM

## 2014-01-19 DIAGNOSIS — R0602 Shortness of breath: Secondary | ICD-10-CM | POA: Diagnosis not present

## 2014-01-19 DIAGNOSIS — J309 Allergic rhinitis, unspecified: Secondary | ICD-10-CM

## 2014-01-19 DIAGNOSIS — F411 Generalized anxiety disorder: Secondary | ICD-10-CM | POA: Diagnosis present

## 2014-01-19 DIAGNOSIS — R609 Edema, unspecified: Secondary | ICD-10-CM

## 2014-01-19 DIAGNOSIS — J9611 Chronic respiratory failure with hypoxia: Secondary | ICD-10-CM

## 2014-01-19 DIAGNOSIS — K589 Irritable bowel syndrome without diarrhea: Secondary | ICD-10-CM

## 2014-01-19 DIAGNOSIS — J449 Chronic obstructive pulmonary disease, unspecified: Secondary | ICD-10-CM

## 2014-01-19 DIAGNOSIS — J4489 Other specified chronic obstructive pulmonary disease: Secondary | ICD-10-CM

## 2014-01-19 DIAGNOSIS — F419 Anxiety disorder, unspecified: Secondary | ICD-10-CM

## 2014-01-19 DIAGNOSIS — R600 Localized edema: Secondary | ICD-10-CM

## 2014-01-19 DIAGNOSIS — J45909 Unspecified asthma, uncomplicated: Secondary | ICD-10-CM

## 2014-01-19 DIAGNOSIS — M899 Disorder of bone, unspecified: Secondary | ICD-10-CM

## 2014-01-19 DIAGNOSIS — J441 Chronic obstructive pulmonary disease with (acute) exacerbation: Secondary | ICD-10-CM

## 2014-01-19 DIAGNOSIS — R0902 Hypoxemia: Secondary | ICD-10-CM

## 2014-01-19 DIAGNOSIS — J9610 Chronic respiratory failure, unspecified whether with hypoxia or hypercapnia: Secondary | ICD-10-CM

## 2014-01-19 HISTORY — DX: Chronic obstructive pulmonary disease, unspecified: J44.9

## 2014-01-19 LAB — URINALYSIS, ROUTINE W REFLEX MICROSCOPIC
Bilirubin Urine: NEGATIVE
GLUCOSE, UA: NEGATIVE mg/dL
Hgb urine dipstick: NEGATIVE
Ketones, ur: NEGATIVE mg/dL
LEUKOCYTES UA: NEGATIVE
NITRITE: NEGATIVE
PH: 7 (ref 5.0–8.0)
Protein, ur: NEGATIVE mg/dL
SPECIFIC GRAVITY, URINE: 1.005 (ref 1.005–1.030)
Urobilinogen, UA: 0.2 mg/dL (ref 0.0–1.0)

## 2014-01-19 LAB — I-STAT CG4 LACTIC ACID, ED: LACTIC ACID, VENOUS: 0.56 mmol/L (ref 0.5–2.2)

## 2014-01-19 LAB — CBC
HEMATOCRIT: 34.1 % — AB (ref 36.0–46.0)
HEMOGLOBIN: 10.9 g/dL — AB (ref 12.0–15.0)
MCH: 31.4 pg (ref 26.0–34.0)
MCHC: 32 g/dL (ref 30.0–36.0)
MCV: 98.3 fL (ref 78.0–100.0)
Platelets: 222 10*3/uL (ref 150–400)
RBC: 3.47 MIL/uL — AB (ref 3.87–5.11)
RDW: 12.2 % (ref 11.5–15.5)
WBC: 6.6 10*3/uL (ref 4.0–10.5)

## 2014-01-19 LAB — COMPREHENSIVE METABOLIC PANEL
ALBUMIN: 3.5 g/dL (ref 3.5–5.2)
ALT: 23 U/L (ref 0–35)
AST: 28 U/L (ref 0–37)
Alkaline Phosphatase: 67 U/L (ref 39–117)
Anion gap: 7 (ref 5–15)
BUN: 9 mg/dL (ref 6–23)
CALCIUM: 9.3 mg/dL (ref 8.4–10.5)
CO2: 40 mEq/L (ref 19–32)
Chloride: 89 mEq/L — ABNORMAL LOW (ref 96–112)
Creatinine, Ser: 0.27 mg/dL — ABNORMAL LOW (ref 0.50–1.10)
GFR calc Af Amer: >90 mL/min (ref >90)
Glucose, Bld: 102 mg/dL — ABNORMAL HIGH (ref 70–99)
Potassium: 4.3 mEq/L (ref 3.7–5.3)
Sodium: 136 mEq/L — ABNORMAL LOW (ref 137–147)
Total Bilirubin: 0.3 mg/dL (ref 0.3–1.2)
Total Protein: 7 g/dL (ref 6.0–8.3)

## 2014-01-19 LAB — PRO B NATRIURETIC PEPTIDE: Pro B Natriuretic peptide (BNP): 329.9 pg/mL — ABNORMAL HIGH (ref 0–125)

## 2014-01-19 LAB — LIPASE, BLOOD: LIPASE: 26 U/L (ref 11–59)

## 2014-01-19 MED ORDER — BUDESONIDE-FORMOTEROL FUMARATE 160-4.5 MCG/ACT IN AERO
2.0000 | INHALATION_SPRAY | Freq: Two times a day (BID) | RESPIRATORY_TRACT | Status: DC
Start: 2014-01-19 — End: 2014-01-23
  Administered 2014-01-20 – 2014-01-23 (×8): 2 via RESPIRATORY_TRACT
  Filled 2014-01-19: qty 6

## 2014-01-19 MED ORDER — VITAMIN D 1000 UNITS PO TABS
1000.0000 [IU] | ORAL_TABLET | Freq: Two times a day (BID) | ORAL | Status: DC
  Administered 2014-01-20 – 2014-01-23 (×8): 1000 [IU] via ORAL
  Filled 2014-01-19 (×9): qty 1

## 2014-01-19 MED ORDER — PREDNISONE 20 MG PO TABS
40.0000 mg | ORAL_TABLET | ORAL | Status: AC
  Administered 2014-01-19: 40 mg via ORAL
  Filled 2014-01-19: qty 2

## 2014-01-19 MED ORDER — ENOXAPARIN SODIUM 40 MG/0.4ML ~~LOC~~ SOLN
40.0000 mg | Freq: Every day | SUBCUTANEOUS | Status: DC
  Administered 2014-01-20 – 2014-01-22 (×4): 40 mg via SUBCUTANEOUS
  Filled 2014-01-19 (×5): qty 0.4

## 2014-01-19 MED ORDER — LORATADINE 10 MG PO TABS
10.0000 mg | ORAL_TABLET | Freq: Every day | ORAL | Status: DC
  Administered 2014-01-20: 10 mg via ORAL
  Filled 2014-01-19 (×2): qty 1

## 2014-01-19 MED ORDER — SODIUM CHLORIDE 0.9 % IJ SOLN
3.0000 mL | Freq: Two times a day (BID) | INTRAMUSCULAR | Status: DC
  Administered 2014-01-20 – 2014-01-21 (×5): 3 mL via INTRAVENOUS

## 2014-01-19 MED ORDER — ONDANSETRON HCL 4 MG/2ML IJ SOLN: 4.0000 mg | Freq: Four times a day (QID) | INTRAMUSCULAR | Status: DC | PRN

## 2014-01-19 MED ORDER — POTASSIUM CHLORIDE CRYS ER 20 MEQ PO TBCR
20.0000 meq | EXTENDED_RELEASE_TABLET | Freq: Every day | ORAL | Status: DC
  Administered 2014-01-20 – 2014-01-22 (×4): 20 meq via ORAL
  Filled 2014-01-19 (×4): qty 1

## 2014-01-19 MED ORDER — ALPRAZOLAM 0.25 MG PO TABS
0.2500 mg | ORAL_TABLET | Freq: Four times a day (QID) | ORAL | Status: DC
  Administered 2014-01-20 – 2014-01-23 (×14): 0.25 mg via ORAL
  Filled 2014-01-19 (×14): qty 1

## 2014-01-19 MED ORDER — IPRATROPIUM-ALBUTEROL 0.5-2.5 (3) MG/3ML IN SOLN
3.0000 mL | Freq: Four times a day (QID) | RESPIRATORY_TRACT | Status: DC
  Administered 2014-01-20: 3 mL via RESPIRATORY_TRACT
  Filled 2014-01-19: qty 3

## 2014-01-19 MED ORDER — SODIUM CHLORIDE 0.9 % IV SOLN: 250.0000 mL | INTRAVENOUS | Status: DC | PRN

## 2014-01-19 MED ORDER — METHYLPREDNISOLONE SODIUM SUCC 40 MG IJ SOLR
40.0000 mg | Freq: Two times a day (BID) | INTRAMUSCULAR | Status: DC
  Administered 2014-01-20 (×2): 40 mg via INTRAVENOUS
  Filled 2014-01-19 (×3): qty 1

## 2014-01-19 MED ORDER — FUROSEMIDE 10 MG/ML IJ SOLN
40.0000 mg | Freq: Every day | INTRAMUSCULAR | Status: DC
  Administered 2014-01-20 – 2014-01-21 (×3): 40 mg via INTRAVENOUS
  Filled 2014-01-19 (×4): qty 4

## 2014-01-19 MED ORDER — FA-PYRIDOXINE-CYANOCOBALAMIN 2.5-25-2 MG PO TABS
1.0000 | ORAL_TABLET | Freq: Every day | ORAL | Status: DC
  Administered 2014-01-20 – 2014-01-23 (×4): 1 via ORAL
  Filled 2014-01-19 (×4): qty 1

## 2014-01-19 MED ORDER — GUAIFENESIN-DM 100-10 MG/5ML PO SYRP: 5.0000 mL | ORAL_SOLUTION | ORAL | Status: DC | PRN

## 2014-01-19 MED ORDER — ALBUTEROL SULFATE (2.5 MG/3ML) 0.083% IN NEBU: 2.5000 mg | INHALATION_SOLUTION | RESPIRATORY_TRACT | Status: DC | PRN

## 2014-01-19 MED ORDER — SODIUM CHLORIDE 0.9 % IJ SOLN
3.0000 mL | INTRAMUSCULAR | Status: DC | PRN
  Administered 2014-01-21: 3 mL via INTRAVENOUS

## 2014-01-19 MED ORDER — ONDANSETRON HCL 4 MG PO TABS: 4.0000 mg | ORAL_TABLET | Freq: Four times a day (QID) | ORAL | Status: DC | PRN

## 2014-01-19 NOTE — ED Notes (Signed)
Pt has COPD, wears 4 L [place] continuously. Pt reports 1 month ago she had UTI, since then has been having urinary retension. Reports 2 weeks ago pt dropped an oxygen cylindar on her right foot, went to urgent care, prescribed abx. Today came in for SOB. 98% on 4 L [place]. Bil leg swelling for last week. Reports pain 10/10. Pt believes she has oxygen depravation.

## 2014-01-19 NOTE — ED Provider Notes (Signed)
CSN: [id number]     Arrival date & time 01/19/14  1904 History   First MD Initiated Contact with Patient 01/19/14 1928     Chief Complaint  Patient presents with  . Shortness of Breath  . Leg Swelling     (Consider location/radiation/quality/duration/timing/severity/associated sxs/prior Treatment) HPI Presents with concerns of dyspnea, bilateral lower extremity edema, chest pressure, decreased urinary capacity. Patient has history of COPD, requires 4 L oxygen 24/7. She notes over the past weeks she has developed bilateral lower extremity edema that progressed over the past 3 days and is now causing substantial pain bilaterally. There is associated mild erythema in both legs. Incidentally, the patient dropped a heavy object onto her right foot approximately 3 weeks ago.  Subsequently she had x-rays, position evaluation, with no evidence of fracture. Patient has no recent steroid use, though she did complete 2 courses of antibiotics within the past month for possible cellulitis and urinary tract infection. Patient denies confusion, disorientation, asymmetric weakness. Chest pressure is with exertion. Past Medical History  Diagnosis Date  . Allergic rhinitis   . Asthma   . Abnormal heart rhythm   . COPD (chronic obstructive pulmonary disease)    Past Surgical History  Procedure Laterality Date  . Breast surgery    . Parathyroidectomy     Family History  Problem Relation Age of Onset  . Heart disease Father    History  Substance Use Topics  . Smoking status: Former Smoker -- 0.50 packs/day for 45 years    Types: Cigarettes    Quit date: 06/27/2006  . Smokeless tobacco: Never Used  . Alcohol Use: No   OB History   Grav Para Term Preterm Abortions TAB SAB Ect Mult Living                 Review of Systems  Constitutional:       Per HPI, otherwise negative  HENT:       Per HPI, otherwise negative  Respiratory:       Per HPI, otherwise negative  Cardiovascular:   Per HPI, otherwise negative  Gastrointestinal: Negative for vomiting.  Endocrine:       Negative aside from HPI  Genitourinary:       Neg aside from HPI   Musculoskeletal:       Per HPI, otherwise negative  Skin: Negative.   Neurological: Negative for syncope.      Allergies  Azithromycin; Clonazepam; Clotrimazole; Darvocet; Duloxetine; Flonase; Influenza virus vacc split pf; Ipratropium-albuterol; Naproxen; Pneumococcal vaccine; Quinolones; Rozerem; Tramadol hcl; Tylenol; Venlafaxine; Xopenex hfa; and Zolpidem tartrate  Home Medications   Prior to Admission medications   Medication Sig Start Date End Date Taking? Authorizing Provider  albuterol (PROAIR HFA) 108 (90 BASE) MCG/ACT inhaler Inhale 2 puffs into the lungs every 6 (six) hours as needed for wheezing. 03/25/13   Leslye Peer, MD  ALPRAZolam Prudy Feeler) 0.5 MG tablet Take 2 tablets (1 mg total) by mouth 2 (two) times daily. 11/15/13   Leslye Peer, MD  Ascorbic Acid (VITAMIN C) 1000 MG tablet Take 500 mg by mouth 2 (two) times daily.      Historical Provider, MD  b complex vitamins tablet Take 1 tablet by mouth daily.      Historical Provider, MD  budesonide-formoterol (SYMBICORT) 160-4.5 MCG/ACT inhaler Inhale 2 puffs into the lungs 2 (two) times daily. 03/25/13   Leslye Peer, MD  cetirizine (ZYRTEC ALLERGY) 10 MG tablet Take 10 mg by mouth at bedtime.  Historical Provider, MD  Cholecalciferol (VITAMIN D3) 1000 UNITS CAPS Take 1 capsule by mouth 2 (two) times daily.      Historical Provider, MD  Hyaluronic Acid-Vitamin C (HYALURONIC ACID PO) Take 1 capsule by mouth 2 (two) times daily.      Historical Provider, MD  Lactase (LACTAID FAST ACT PO) as needed.      Historical Provider, MD  MAGNESIUM CITRATE PO Take by mouth. 400mg  tablet.  1/2 tablet twice daily     Historical Provider, MD  MULTIPLE VITAMINS PO Take 1 tablet by mouth daily. Name:  [place]    Historical Provider, MD  peppermint oil liquid takes 1 capsule as  needed     Historical Provider, MD  Probiotic Product (PROBIOTIC PO) Take 1 capsule by mouth 2 (two) times daily.      Historical Provider, MD   BP 119/40  Pulse 87  Temp(Src) 98.1 F (36.7 C) (Oral)  Resp 20  SpO2 98% Physical Exam  Nursing note and vitals reviewed. Constitutional: She is oriented to person, place, and time. She appears cachectic. She has a sickly appearance. No distress.  Eyes: Conjunctivae are normal. Right eye exhibits no discharge. Left eye exhibits no discharge.  Cardiovascular: Normal rate, regular rhythm and intact distal pulses.   Pulmonary/Chest: No respiratory distress.  Abdominal: She exhibits no distension.  Musculoskeletal: She exhibits edema.  Symmetric bilateral pitting edema in the lower extremities. Patient moves both flexion and spontaneously, he is ambulatory.   Neurological: She is alert and oriented to person, place, and time. She displays atrophy. No cranial nerve deficit. Coordination normal.  Psychiatric: Her affect is angry. She is slowed. Thought content is paranoid.    ED Course  Procedures (including critical care time) Labs Review Labs Reviewed  CBC - Abnormal; Notable for the following:    RBC 3.47 (*)    Hemoglobin 10.9 (*)    HCT 34.1 (*)    All other components within normal limits  COMPREHENSIVE METABOLIC PANEL - Abnormal; Notable for the following:    Sodium 136 (*)    Chloride 89 (*)    CO2 40 (*)    Glucose, Bld 102 (*)    Creatinine, Ser 0.27 (*)    All other components within normal limits  PRO B NATRIURETIC PEPTIDE - Abnormal; Notable for the following:    Pro B Natriuretic peptide (BNP) 329.9 (*)    All other components within normal limits  URINALYSIS, ROUTINE W REFLEX MICROSCOPIC  LIPASE, BLOOD  I-STAT CG4 LACTIC ACID, ED    Imaging Review Dg Chest 2 View (if Patient Has Fever And/or Copd)  01/19/2014   CLINICAL DATA:  Shortness of breath and bilateral leg swelling.  EXAM: CHEST  2 VIEW  COMPARISON:  None.   FINDINGS: The heart is enlarged but stable. There are advanced emphysematous changes and pulmonary scarring but no definite acute overlying pulmonary process. The bony thorax is intact.  IMPRESSION: Chronic emphysematous changes but no acute pulmonary findings.   Electronically Signed   By: Loralie Champagne M.D.   On: 01/19/2014 20:17     EKG Interpretation   Date/Time:  Sunday January 19 2014 20:44:11 EDT Ventricular Rate:  88 PR Interval:  136 QRS Duration: 65 QT Interval:  362 QTC Calculation: 438 R Axis:   81 Text Interpretation:  Sinus rhythm Multiple premature complexes, vent   Probable left atrial enlargement Left ventricular hypertrophy Anterior  infarct, old Baseline wander in lead(s) II III aVR aVF Confirmed by  JAMES   MD, MARK ([id number]) on 01/19/2014 8:55:49 PM     On exam the patient appears in similar condition. MDM   Final diagnoses:  Dyspnea  Bilateral lower extremity edema   Patient presents with concerns of dyspnea, new bilateral lower extremity edema.  Patient is awake, alert, afebrile, with oxygen saturation 100% 4 L nasal cannula, which is her baseline.  However, with her description of increased respiratory need, there is some concern for COPD exacerbation.  Given the new lower extremities edema, which is symmetric and no recent local echocardiogram, patient also would benefit from additional evaluation, management of this concern. Patient was admitted for further evaluation and management.     Gerhard Munchobert Garin Mata, MD 01/19/14 2211

## 2014-01-19 NOTE — H&P (Signed)
PATIENT DETAILS Name: Judy Gonzalez Age: 68 y.o. Sex: female Date of Birth: February 09, 1946 Admit Date: 01/19/2014 [id number], Judy Starch, Judy Gonzalez   CHIEF COMPLAINT:  Worsening shortness of breath for the past 2-3 days Bilateral lower extremity edema 7 days  HPI: Judy Gonzalez is a 68 y.o. female with a Past Medical History of COPD, chronic respiratory failure on home O2-4 L 24/7, anxiety who presents today with the above noted complaint. The patient, for the past week or so, she has noted worsening swelling of bilateral lower extremities, but has significantly worsened in the past 2-3 days. She also claims that her shortness of breath is much more worse than usual baseline. Because of these reasons, she presented to the emergency room, she was found to have 3+ pitting edema bilaterally, she was thought to have COPD with mild exacerbation I was asked to admit this patient for further evaluation and treatment Over the past month or so, patient was initially diagnosed with a urinary tract retention for which she was treated with doxycycline. She claims that she has had issues with intermittent urinary retention as well. Approximately 2-3 weeks ago, she dropped her oxygen tank on her right foot, she subsequently went to a local urgent care and had x-rays that were negative for fracture, she was again treated with doxycycline for 10 days for cellulitis, which is completed a few days back. Patient denies any headache, fever, chest pain, nausea, vomiting or diarrhea. She denies any abdominal pain.   ALLERGIES:   Allergies  Allergen Reactions  . Azithromycin   . Clonazepam   . Clotrimazole   . Darvocet [Propoxyphene N-Acetaminophen]   . Duloxetine     Cymbalta   . Flonase [Fluticasone Propionate]     SOB, dizziness  . Influenza Virus Vacc Split Pf   . Ipratropium-Albuterol   . Naproxen     Naprosyn, aleve   . Pneumococcal Vaccine   . Quinolones   . Rozerem [Ramelteon]   .  Tramadol Hcl   . Tylenol [Acetaminophen]   . Venlafaxine     "Effexor"  . Xopenex Hfa [Levalbuterol Tartrate]   . Zolpidem Tartrate     PAST MEDICAL HISTORY: Past Medical History  Diagnosis Date  . Allergic rhinitis   . Asthma   . Abnormal heart rhythm   . COPD (chronic obstructive pulmonary disease)     PAST SURGICAL HISTORY: Past Surgical History  Procedure Laterality Date  . Breast surgery    . Parathyroidectomy      MEDICATIONS AT HOME: Prior to Admission medications   Medication Sig Start Date End Date Taking? Authorizing Provider  albuterol (PROAIR HFA) 108 (90 BASE) MCG/ACT inhaler Inhale 2 puffs into the lungs every 6 (six) hours as needed for wheezing. 03/25/13  Yes Leslye Peer, Judy Gonzalez  ALPRAZolam Prudy Feeler) 0.5 MG tablet Take 0.25 mg by mouth every 6 (six) hours.   Yes Historical Provider, Judy Gonzalez  b complex vitamins tablet Take 1 tablet by mouth daily.     Yes Historical Provider, Judy Gonzalez  budesonide-formoterol (SYMBICORT) 160-4.5 MCG/ACT inhaler Inhale 2 puffs into the lungs 2 (two) times daily. 03/25/13  Yes Leslye Peer, Judy Gonzalez  cetirizine (ZYRTEC ALLERGY) 10 MG tablet Take 10 mg by mouth at bedtime.     Yes Historical Provider, Judy Gonzalez  Cholecalciferol (VITAMIN D3) 1000 UNITS CAPS Take 1 capsule by mouth 2 (two) times daily.     Yes Historical Provider, Judy Gonzalez  Hyaluronic Acid-Vitamin C (HYALURONIC ACID PO) Take  1 capsule by mouth 2 (two) times daily.     Yes Historical Provider, Judy Gonzalez  Lactase (LACTAID FAST ACT PO) as needed.     Yes Historical Provider, Judy Gonzalez  peppermint oil liquid takes 1 capsule as needed    Yes Historical Provider, Judy Gonzalez  Probiotic Product (PROBIOTIC PO) Take 1 capsule by mouth 2 (two) times daily.     Yes Historical Provider, Judy Gonzalez    FAMILY HISTORY: Family History  Problem Relation Age of Onset  . Heart disease Father    SOCIAL HISTORY:  reports that she quit smoking about 7 years ago. Her smoking use included Cigarettes. She has a 22.5 pack-year smoking history. She has  never used smokeless tobacco. She reports that she does not drink alcohol or use illicit drugs.  REVIEW OF SYSTEMS:  Constitutional:   No  weight loss, night sweats,  Fevers, chills, fatigue.  HEENT:    No headaches, Difficulty swallowing,Tooth/dental problems,Sore throat,  No sneezing, itching, ear ache, nasal congestion, post nasal drip,   Cardio-vascular: No chest pain,  dizziness, palpitations  GI:  No heartburn, indigestion, abdominal pain, nausea, vomiting, diarrhea, change in  bowel habits, loss of appetite  Resp: No shortness of breath rest.  No excess mucus, no productive cough, No non-productive cough,  No coughing up of blood.No change in color of mucus.No wheezing.No chest wall deformity  Skin:  no rash or lesions.  GU:  no dysuria, change in color of urine, no urgency or frequency.  No flank pain.  Musculoskeletal: No joint pain or swelling.  No decreased range of motion.  No back pain.  Psych: No change in mood or affect. No depression or anxiety.  No memory loss.   PHYSICAL EXAM: Blood pressure 127/62, pulse 95, temperature 98.1 F (36.7 C), temperature source Oral, resp. rate 19, SpO2 100.00%.  General appearance :Awake, alert, not in any distress. Speech Clear. Not toxic Looking HEENT: Atraumatic and Normocephalic, pupils equally reactive to light and accomodation Neck: supple, no JVD. No cervical lymphadenopathy.  Chest: Good air entry bilaterally but decreased at bases, few scattered rhonchi all over. CVS: S1 S2 regular, no murmurs.  Abdomen: Bowel sounds present, Non tender and not distended with no gaurding, rigidity or rebound. Extremities: B/L Lower Ext shows 3+ edema, both legs are warm to touch Neurology: Awake alert, and oriented X 3, CN II-XII intact, Non focal Skin:No Rash Wounds:N/A  LABS ON ADMISSION:   Recent Labs  01/19/14 2017  NA 136*  K 4.3  CL 89*  CO2 40*  GLUCOSE 102*  BUN 9  CREATININE 0.27*  CALCIUM 9.3    Recent  Labs  01/19/14 2017  AST 28  ALT 23  ALKPHOS 67  BILITOT 0.3  PROT 7.0  ALBUMIN 3.5    Recent Labs  01/19/14 2017  LIPASE 26    Recent Labs  01/19/14 2016  WBC 6.6  HGB 10.9*  HCT 34.1*  MCV 98.3  PLT 222   No results found for this basename: CKTOTAL, CKMB, CKMBINDEX, TROPONINI,  in the last 72 hours No results found for this basename: DDIMER,  in the last 72 hours No components found with this basename: POCBNP,    RADIOLOGIC STUDIES ON ADMISSION: Dg Chest 2 View (if Patient Has Fever And/or Copd)  01/19/2014   CLINICAL DATA:  Shortness of breath and bilateral leg swelling.  EXAM: CHEST  2 VIEW  COMPARISON:  None.  FINDINGS: The heart is enlarged but stable. There are advanced emphysematous changes and  pulmonary scarring but no definite acute overlying pulmonary process. The bony thorax is intact.  IMPRESSION: Chronic emphysematous changes but no acute pulmonary findings.   Electronically Signed   By: Loralie ChampagneMark  Gallerani M.D.   On: 01/19/2014 20:17     EKG: Independently reviewed. Normal sinus rhythm  ASSESSMENT AND PLAN: Present on Admission:  . COPD with exacerbation - Admit to telemetry unit, start on IV Solu-Medrol, scheduled nebulized bronchodilators. Currently seems stable, not in acute distress. Follow clinical course, slowly taper off steroids. On home O2-4 L/minute-24/7  . Bilateral lower extremity edema - Has significant lower extremity edema that has developed over the past week or so, UA negative for proteinuria, LFTs essentially normal. Could have developed cor pulmonale, we'll check an echocardiogram, start IV Lasix. We'll also check lower extremity Doppler ultrasound. Has some mild lower abdominal distention that the patient reports to be chronic, if her echocardiogram and lower extreme Doppler ultrasound are negative, she may benefit from imaging of the pelvis to see if this is any compression of the pelvic veins- causing bilateral lower extremity edema.  Marland Kitchen.  Anxiety - Continue Xanax   . Chronic respiratory failure - Currently stable, requiring 4 L to maintain O2 saturations. We'll continue to monitor closely.   Marland Kitchen. History of irritable bowel syndrome - Stable.  Further plan will depend as patient's clinical course evolves and further radiologic and laboratory data become available. Patient will be monitored closely.  Above noted plan was discussed with patient/daughter, they were in agreement.   DVT Prophylaxis: Prophylactic Lovenox  Code Status: Full Code   Total time spent for admission equals 45 minutes.  Bro[facility] Army Medical CenterGHIMIRE,SHANKER [facility] Pager [phone]  If 7PM-7AM, please contact night-coverage [email or web address] Password [email or web address] 01/19/2014, 10:55 PM  **Disclaimer: This note may have been dictated with voice recognition software. Similar sounding words can inadvertently be transcribed and this note may contain transcription errors which may not have been corrected upon publication of note.**

## 2014-01-20 ENCOUNTER — Inpatient Hospital Stay (HOSPITAL_COMMUNITY): Payer: Medicare Other

## 2014-01-20 ENCOUNTER — Telehealth: Payer: Self-pay | Admitting: Pulmonary Disease

## 2014-01-20 DIAGNOSIS — R0602 Shortness of breath: Secondary | ICD-10-CM

## 2014-01-20 DIAGNOSIS — J449 Chronic obstructive pulmonary disease, unspecified: Secondary | ICD-10-CM

## 2014-01-20 DIAGNOSIS — J9600 Acute respiratory failure, unspecified whether with hypoxia or hypercapnia: Secondary | ICD-10-CM

## 2014-01-20 DIAGNOSIS — R0609 Other forms of dyspnea: Secondary | ICD-10-CM

## 2014-01-20 DIAGNOSIS — R0989 Other specified symptoms and signs involving the circulatory and respiratory systems: Secondary | ICD-10-CM

## 2014-01-20 DIAGNOSIS — M7989 Other specified soft tissue disorders: Secondary | ICD-10-CM

## 2014-01-20 LAB — BASIC METABOLIC PANEL
BUN: 16 mg/dL (ref 6–23)
CHLORIDE: 86 meq/L — AB (ref 96–112)
CO2: >45 mEq/L (ref 19–32)
CREATININE: 0.43 mg/dL — AB (ref 0.50–1.10)
Calcium: 8.9 mg/dL (ref 8.4–10.5)
GFR calc Af Amer: >90 mL/min (ref >90)
GFR calc non Af Amer: >90 mL/min (ref >90)
Glucose, Bld: 156 mg/dL — ABNORMAL HIGH (ref 70–99)
Potassium: 4.4 mEq/L (ref 3.7–5.3)
Sodium: 139 mEq/L (ref 137–147)

## 2014-01-20 LAB — CBC
HCT: 36.4 % (ref 36.0–46.0)
HEMOGLOBIN: 11.2 g/dL — AB (ref 12.0–15.0)
MCH: 30.9 pg (ref 26.0–34.0)
MCHC: 30.8 g/dL (ref 30.0–36.0)
MCV: 100.3 fL — ABNORMAL HIGH (ref 78.0–100.0)
Platelets: 220 10*3/uL (ref 150–400)
RBC: 3.63 MIL/uL — ABNORMAL LOW (ref 3.87–5.11)
RDW: 12.1 % (ref 11.5–15.5)
WBC: 7.9 10*3/uL (ref 4.0–10.5)

## 2014-01-20 MED ORDER — BACID PO TABS
2.0000 | ORAL_TABLET | Freq: Three times a day (TID) | ORAL | Status: DC
  Administered 2014-01-20 – 2014-01-22 (×7): 2 via ORAL
  Filled 2014-01-20 (×10): qty 2

## 2014-01-20 MED ORDER — ALPRAZOLAM 0.25 MG PO TABS: 0.2500 mg | ORAL_TABLET | Freq: Once | ORAL | Status: AC

## 2014-01-20 MED ORDER — NYSTATIN 100000 UNIT/ML MT SUSP
5.0000 mL | Freq: Three times a day (TID) | OROMUCOSAL | Status: DC
  Administered 2014-01-20 – 2014-01-23 (×8): 500000 [IU] via ORAL
  Filled 2014-01-20 (×10): qty 5

## 2014-01-20 MED ORDER — ALPRAZOLAM 0.25 MG PO TABS
0.2500 mg | ORAL_TABLET | Freq: Once | ORAL | Status: AC
  Administered 2014-01-20: 0.25 mg via ORAL
  Filled 2014-01-20: qty 1

## 2014-01-20 MED ORDER — B COMPLEX-C PO TABS
1.0000 | ORAL_TABLET | Freq: Every day | ORAL | Status: DC
  Administered 2014-01-21 – 2014-01-23 (×3): 1 via ORAL
  Filled 2014-01-20 (×4): qty 1

## 2014-01-20 MED ORDER — IPRATROPIUM-ALBUTEROL 0.5-2.5 (3) MG/3ML IN SOLN
3.0000 mL | Freq: Three times a day (TID) | RESPIRATORY_TRACT | Status: DC
  Filled 2014-01-20: qty 3

## 2014-01-20 MED ORDER — METHYLPREDNISOLONE SODIUM SUCC 40 MG IJ SOLR
40.0000 mg | INTRAMUSCULAR | Status: DC
  Filled 2014-01-20: qty 1

## 2014-01-20 NOTE — Progress Notes (Signed)
CRITICAL VALUE ALERT  Critical value received:  CO2 >45  Date of notification:  01/20/14  Time of notification:  0614  Critical value read back:Yes.    Nurse who received alert:  J.Alayne Estrella   MD notified (1st page): P.Joseph   Time of first page:  0615  MD notified (2nd page):  Time of second page:  Responding MD:  P.Joseph  Time MD responded:  0616   No new orders. Will continue to monitor..Marland Kitchen

## 2014-01-20 NOTE — Telephone Encounter (Signed)
Admitted to hospital She feels concentrator not putting out enough O2 with a 5950ft line. PL have [facility]  Check her concentrator after hosp dc (they must know her !)- we will advise on O2 usage

## 2014-01-20 NOTE — Progress Notes (Signed)
Pt ambulated in hallway today on 4L of O2 [place].  Pt's lowest O2 sat was 83%.  Pt rested, caught her breath, and O2 was 91%.  Pt aware of limitations, returned to room.

## 2014-01-20 NOTE — Progress Notes (Addendum)
Patientt want to keep her home medicine and vitamins at her bedside. 2 RN's explained to the patient about sending her home medicine at home or send it to the pharmacy but keeps on refusing. By the help of her daughter and explaining everything very well to the patient. Patient agreed to send her medicine at home. Pt just want to be assured about getting all of her medicine and vitamins to be order. RN already got the orders. Pt is calm and compliant. Will continue to monitor.

## 2014-01-20 NOTE — Consult Note (Addendum)
Name: Judy DingwallJanis Gonzalez MRN: [id number] DOB: 1945/12/02    ADMISSION DATE:  01/19/2014 CONSULTATION DATE:  7/27  REFERRING MD :  [facility] PRIMARY SERVICE: [facility], Delton CoombesByrum Pulmonary MD  CHIEF COMPLAINT:  Oxygen deprivation  BRIEF PATIENT DESCRIPTION:  68 yo WF, former smoker, admitted with increased dyspnea, increased lower ext edema and malfunctioning O2 concentrators.   SIGNIFICANT EVENTS / STUDIES:  7/26 admitted 7/26 steroids  LINES / TUBES:   CULTURES:   ANTIBIOTICS:   HISTORY OF PRESENT ILLNESS:  68 yo WF, former smoker, admitted with increased dyspnea, increased lower ext edema and malfunctioning O2 concentrators on 7/26. She feels that her home concentrators (5) have all malfunctioned and she has declined over the last 18 months due to over long O2 tubing(50 feet) that is delivering inadequate amounts of O2. She reports using cylinder O2 tanks 16 in 4 days and they are also malfunctioning.  She does have increased lower ext edema , treated with lasix on admit and she is requesting increased doses of xanax. PCCM asked to evaluate.  PAST MEDICAL HISTORY :  Past Medical History  Diagnosis Date  . Allergic rhinitis   . Asthma   . Abnormal heart rhythm   . COPD (chronic obstructive pulmonary disease)    Past Surgical History  Procedure Laterality Date  . Breast surgery    . Parathyroidectomy     Prior to Admission medications   Medication Sig Start Date End Date Taking? Authorizing Provider  albuterol (PROAIR HFA) 108 (90 BASE) MCG/ACT inhaler Inhale 2 puffs into the lungs every 6 (six) hours as needed for wheezing. 03/25/13  Yes Leslye Peerobert S Byrum, MD  ALPRAZolam Prudy Feeler(XANAX) 0.5 MG tablet Take 0.25 mg by mouth every 6 (six) hours.   Yes Historical Provider, MD  b complex vitamins tablet Take 1 tablet by mouth daily.     Yes Historical Provider, MD  budesonide-formoterol (SYMBICORT) 160-4.5 MCG/ACT inhaler Inhale 2 puffs into the lungs 2 (two) times daily. 03/25/13  Yes Leslye Peerobert S  Byrum, MD  cetirizine (ZYRTEC ALLERGY) 10 MG tablet Take 10 mg by mouth at bedtime.     Yes Historical Provider, MD  Cholecalciferol (VITAMIN D3) 1000 UNITS CAPS Take 1 capsule by mouth 2 (two) times daily.     Yes Historical Provider, MD  Hyaluronic Acid-Vitamin C (HYALURONIC ACID PO) Take 1 capsule by mouth 2 (two) times daily.     Yes Historical Provider, MD  Lactase (LACTAID FAST ACT PO) as needed.     Yes Historical Provider, MD  peppermint oil liquid takes 1 capsule as needed    Yes Historical Provider, MD  Probiotic Product (PROBIOTIC PO) Take 1 capsule by mouth 2 (two) times daily.     Yes Historical Provider, MD   Allergies  Allergen Reactions  . Azithromycin   . Clonazepam   . Clotrimazole   . Darvocet [Propoxyphene N-Acetaminophen]   . Duloxetine     Cymbalta   . Flonase [Fluticasone Propionate]     SOB, dizziness  . Influenza Virus Vacc Split Pf   . Ipratropium-Albuterol   . Naproxen     Naprosyn, aleve   . Pneumococcal Vaccine   . Quinolones   . Rozerem [Ramelteon]   . Tramadol Hcl   . Tylenol [Acetaminophen]   . Venlafaxine     "Effexor"  . Xopenex Hfa [Levalbuterol Tartrate]   . Zolpidem Tartrate     FAMILY HISTORY:  Family History  Problem Relation Age of Onset  . Heart disease Father  SOCIAL HISTORY:  reports that she quit smoking about 7 years ago. Her smoking use included Cigarettes. She has a 22.5 pack-year smoking history. She has never used smokeless tobacco. She reports that she does not drink alcohol or use illicit drugs.  REVIEW OF SYSTEMS:   10 point review of system taken, please see HPI for positives and negatives.  SUBJECTIVE:   VITAL SIGNS: Temp:  [98 F (36.7 C)-98.3 F (36.8 C)] 98.3 F (36.8 C) (07/27 0701) Pulse Rate:  [84-111] 111 (07/27 0701) Resp:  [16-23] 18 (07/27 0701) BP: (113-127)/(39-62) 113/62 mmHg (07/27 0701) SpO2:  [93 %-100 %] 99 % (07/27 1000) Weight:  [99 lb 11.2 oz (45.224 kg)] 99 lb 11.2 oz (45.224 kg)  (07/26 2300)  PHYSICAL EXAMINATION: General:  Frail, cachetic . Anxious WF NAD at rest Neuro: Suspect some element of confusion.  HEENT:  No JVD/LAN Cardiovascular:  HSR RRR Lungs:  Decreased air movement, no wheezes, kyphotic Abdomen: Soft +bs Musculoskeletal:  Intact Skin:  3 + lower ext pitting edema   Recent Labs Lab 01/19/14 2017 01/20/14 0452  NA 136* 139  K 4.3 4.4  CL 89* 86*  CO2 40* >45*  BUN 9 16  CREATININE 0.27* 0.43*  GLUCOSE 102* 156*    Recent Labs Lab 01/19/14 2016 01/20/14 0452  HGB 10.9* 11.2*  HCT 34.1* 36.4  WBC 6.6 7.9  PLT 222 220   Dg Chest 2 View (if Patient Has Fever And/or Copd)  01/19/2014   CLINICAL DATA:  Shortness of breath and bilateral leg swelling.  EXAM: CHEST  2 VIEW  COMPARISON:  None.  FINDINGS: The heart is enlarged but stable. There are advanced emphysematous changes and pulmonary scarring but no definite acute overlying pulmonary process. The bony thorax is intact.  IMPRESSION: Chronic emphysematous changes but no acute pulmonary findings.   Electronically Signed   By: Loralie Champagne M.D.   On: 01/19/2014 20:17   Dg Abd 2 Views  01/20/2014   CLINICAL DATA:  Abdominal pain and distention  EXAM: ABDOMEN - 2 VIEW  COMPARISON:  None.  FINDINGS: There is no free intraperitoneal gas. No disproportionate dilatation of bowel. Phleboliths project over the pelvis.  IMPRESSION: Nonobstructive bowel gas pattern.   Electronically Signed   By: Maryclare Bean M.D.   On: 01/20/2014 07:56    ASSESSMENT / PLAN:  Acute on chronic hypoxic hypoxia respiratory failure with component of volume overload. She reports a confused summary of multiple o2 CONCENTRATOR MALFUNCTIONS that have caused her decline over the last 18 months. When all facts are examined her main request is for increased anxiolytics.   Plan: Agree with current treatment\Consider dc steroids as this may increase her confusion and anxiety.  Ambulate in hall on O2 and document lowest O2  desaturation to confirm that she is indeed desaturating on O2 and if increased O2 with exertion may help.  Increase xanax as needed  Consider hospice in future.  Brett Canales Minor ACNP [facility] Pollack PCCM Pager [phone] till 3 pm If no answer page [phone]  .Independently examined pt, evaluated data & formulated above care plan with NP who scribed this note & edited by me. WIll need to contact [facility] on discharge & ensure O2 is set up for her priortno discharge Also will need to clarify O2 needs on exertion. Albuterol & duonebs make her jittery so try to avoid unless severe bspasm. Ct symbicort - note Dr Delton Coombes was starting discussion about hospice with her  Oretha Milch. MD  01/20/2014, 11:10 AM

## 2014-01-20 NOTE — Progress Notes (Signed)
Echocardiogram 2D Echocardiogram has been performed.  Teckla Christiansen 01/20/2014, 12:01 PM 

## 2014-01-20 NOTE — Progress Notes (Signed)
*  PRELIMINARY RESULTS* Vascular Ultrasound Lower extremity venous duplex has been completed.  Preliminary findings: no evidence of DVT or baker's cyst.  Farrel DemarkJill Eunice, RDMS, RVT  01/20/2014, 2:31 PM

## 2014-01-20 NOTE — Progress Notes (Addendum)
[facility] PROGRESS NOTE  Judy DingwallJanis Gonzalez ZOX:[id number] DOB: 1945-08-07 DOA: 01/19/2014 PCP: Grace IsaacSmith, Lafayette Lyle, MD  Assessment/Plan: 1-COPD with exacerbation  - gets adverse effects from duo nebs. She only use at home albuterol but that make her jittery too.  -taper steroids, change solumedrol to once a day. Dc duo nebs. Albuterol PRN as need, could try xopenex.  -Appreciate Pulmonary evaluation.  -Need to check oxygen concentrator.  -PRN xanax.   . Bilateral lower extremity edema  - Has significant lower extremity edema that has developed over the past week or so, UA negative for proteinuria, LFTs essentially normal. Albumin normal.  -ECHO with normal EF. Right ventricule with normal function and wall. Pulmonary artery pressure 33.  -Continue with  IV Lasix.  - lower extremity Doppler ultrasound negative for DVT.  -will check CT abdomen and pelvis.  Has some mild lower abdominal distention that the patient reports to be chronic, if her echocardiogram and lower extreme Doppler ultrasound are negative, she may benefit from imaging of the pelvis to see if this is any compression of the pelvic veins- causing bilateral lower extremity edema.   Marland Kitchen. Anxiety  - Continue Xanax   . Chronic respiratory failure  - Currently stable, requiring 4 L to maintain O2 saturations.  . History of irritable bowel syndrome  - Stable.   DVT Prophylaxis:  Prophylactic Lovenox   Code Status: Full Code.  Family Communication: care discussed with patient.  Disposition Plan: to be determine.    Consultants:  pulmonary  Procedures:  Doppler  ECHO  Antibiotics:  none  HPI/Subjective: Eating breakfast, wants to finished eating breakfast,. She is hungry. She is " allergic to duo-neb " she is now anxious after she received duo-nebs. Needs he symbicort, needs something for anxiety.   Objective: Filed Vitals:   01/20/14 1359  BP: 108/57  Pulse: 94  Temp: 98.3 F (36.8 C)  Resp:  18    Intake/Output Summary (Last 24 hours) at 01/20/14 1650 Last data filed at 01/20/14 1359  Gross per 24 hour  Intake   1080 ml  Output   4525 ml  Net  -3445 ml   Filed Weights   01/19/14 2300  Weight: 45.224 kg (99 lb 11.2 oz)    Exam:   General:  Alert in no distress.   Cardiovascular: S 1, S 2 RRR  Respiratory: no wheezing.   Abdomen: Bs present, distended, NT,   Musculoskeletal: plus 2 edema LE.    Data Reviewed: Basic Metabolic Panel:  Recent Labs Lab 01/19/14 2017 01/20/14 0452  NA 136* 139  K 4.3 4.4  CL 89* 86*  CO2 40* >45*  GLUCOSE 102* 156*  BUN 9 16  CREATININE 0.27* 0.43*  CALCIUM 9.3 8.9   Liver Function Tests:  Recent Labs Lab 01/19/14 2017  AST 28  ALT 23  ALKPHOS 67  BILITOT 0.3  PROT 7.0  ALBUMIN 3.5    Recent Labs Lab 01/19/14 2017  LIPASE 26   No results found for this basename: AMMONIA,  in the last 168 hours CBC:  Recent Labs Lab 01/19/14 2016 01/20/14 0452  WBC 6.6 7.9  HGB 10.9* 11.2*  HCT 34.1* 36.4  MCV 98.3 100.3*  PLT 222 220   Cardiac Enzymes: No results found for this basename: CKTOTAL, CKMB, CKMBINDEX, TROPONINI,  in the last 168 hours BNP (last 3 results)  Recent Labs  01/19/14 2017  PROBNP 329.9*   CBG: No results found for this basename: GLUCAP,  in the last  168 hours  No results found for this or any previous visit (from the past 240 hour(s)).   Studies: Dg Chest 2 View (if Patient Has Fever And/or Copd)  01/19/2014   CLINICAL DATA:  Shortness of breath and bilateral leg swelling.  EXAM: CHEST  2 VIEW  COMPARISON:  None.  FINDINGS: The heart is enlarged but stable. There are advanced emphysematous changes and pulmonary scarring but no definite acute overlying pulmonary process. The bony thorax is intact.  IMPRESSION: Chronic emphysematous changes but no acute pulmonary findings.   Electronically Signed   By: Loralie Champagne M.D.   On: 01/19/2014 20:17   Dg Abd 2 Views  01/20/2014    CLINICAL DATA:  Abdominal pain and distention  EXAM: ABDOMEN - 2 VIEW  COMPARISON:  None.  FINDINGS: There is no free intraperitoneal gas. No disproportionate dilatation of bowel. Phleboliths project over the pelvis.  IMPRESSION: Nonobstructive bowel gas pattern.   Electronically Signed   By: Maryclare Bean M.D.   On: 01/20/2014 07:56    Scheduled Meds: . ALPRAZolam  0.25 mg Oral Q6H  . B-complex with vitamin C  1 tablet Oral Daily  . budesonide-formoterol  2 puff Inhalation BID  . cholecalciferol  1,000 Units Oral BID  . enoxaparin (LOVENOX) injection  40 mg Subcutaneous QHS  . folic acid-pyridoxine-cyancobalamin  1 tablet Oral Daily  . furosemide  40 mg Intravenous Daily  . lactobacillus acidophilus  2 tablet Oral TID  . loratadine  10 mg Oral Daily  . methylPREDNISolone (SOLU-MEDROL) injection  40 mg Intravenous Q12H  . potassium chloride  20 mEq Oral Daily  . sodium chloride  3 mL Intravenous Q12H   Continuous Infusions:   Principal Problem:   COPD with exacerbation Active Problems:   Anxiety   Chronic respiratory failure   Bilateral lower extremity edema   IBS (irritable bowel syndrome)    Time spent: 25 minutes.     Hartley Barefoot A  [facility] Pager [phone]. If 7PM-7AM, please contact night-coverage at [email or web address], password [facility] 01/20/2014, 4:50 PM  LOS: 1 day

## 2014-01-21 ENCOUNTER — Inpatient Hospital Stay (HOSPITAL_COMMUNITY): Payer: Medicare Other

## 2014-01-21 ENCOUNTER — Encounter (HOSPITAL_COMMUNITY): Payer: Self-pay | Admitting: Radiology

## 2014-01-21 ENCOUNTER — Telehealth: Payer: Self-pay | Admitting: Emergency Medicine

## 2014-01-21 DIAGNOSIS — J449 Chronic obstructive pulmonary disease, unspecified: Secondary | ICD-10-CM

## 2014-01-21 LAB — BASIC METABOLIC PANEL
ANION GAP: 14 (ref 5–15)
BUN: 6 mg/dL (ref 6–23)
CO2: 29 mEq/L (ref 19–32)
Calcium: 10 mg/dL (ref 8.4–10.5)
Chloride: 97 mEq/L (ref 96–112)
Creatinine, Ser: 0.56 mg/dL (ref 0.50–1.10)
GLUCOSE: 125 mg/dL — AB (ref 70–99)
POTASSIUM: 3.8 meq/L (ref 3.7–5.3)
Sodium: 140 mEq/L (ref 137–147)

## 2014-01-21 MED ORDER — IOHEXOL 300 MG/ML  SOLN
80.0000 mL | Freq: Once | INTRAMUSCULAR | Status: AC | PRN
Start: 2014-01-21 — End: 2014-01-21
  Administered 2014-01-21: 80 mL via INTRAVENOUS

## 2014-01-21 MED ORDER — DIPHENHYDRAMINE HCL 25 MG PO CAPS: 25.0000 mg | ORAL_CAPSULE | Freq: Four times a day (QID) | ORAL | Status: DC | PRN

## 2014-01-21 MED ORDER — ALPRAZOLAM 0.25 MG PO TABS
0.2500 mg | ORAL_TABLET | Freq: Once | ORAL | Status: AC
  Administered 2014-01-21: 0.25 mg via ORAL
  Filled 2014-01-21: qty 1

## 2014-01-21 MED ORDER — LORATADINE 10 MG PO TABS
10.0000 mg | ORAL_TABLET | Freq: Every day | ORAL | Status: DC
  Administered 2014-01-21 – 2014-01-22 (×2): 10 mg via ORAL
  Filled 2014-01-21 (×3): qty 1

## 2014-01-21 MED ORDER — PREDNISONE 20 MG PO TABS
40.0000 mg | ORAL_TABLET | Freq: Every day | ORAL | Status: DC
  Filled 2014-01-21: qty 2

## 2014-01-21 MED ORDER — FAMOTIDINE IN NACL 20-0.9 MG/50ML-% IV SOLN
20.0000 mg | INTRAVENOUS | Status: DC
  Administered 2014-01-21: 20 mg via INTRAVENOUS
  Filled 2014-01-21 (×2): qty 50

## 2014-01-21 MED ORDER — IOHEXOL 300 MG/ML  SOLN: 25.0000 mL | INTRAMUSCULAR | Status: AC

## 2014-01-21 MED ORDER — METHYLPREDNISOLONE SODIUM SUCC 125 MG IJ SOLR
60.0000 mg | Freq: Every day | INTRAMUSCULAR | Status: DC
  Administered 2014-01-21: 60 mg via INTRAVENOUS
  Filled 2014-01-21 (×2): qty 0.96

## 2014-01-21 MED ORDER — PREDNISONE 50 MG PO TABS
50.0000 mg | ORAL_TABLET | Freq: Every day | ORAL | Status: DC
  Filled 2014-01-21: qty 1

## 2014-01-21 NOTE — Progress Notes (Addendum)
Informed patient that her doctor had prescribed her breathing with duo-neb. After less than a minute on the tx. Patient stated she was allergic to albuterol/atrovent and xopenex. They made her too jittery. Took patient off tx. And informed Dr. Sunnie Nielsenegalado who went in and talked with patient. She agreed to take her symbicort and stated her lungs were fine that it was her O2 concentrator at home that was not functioning properly because of her fifty foot cord.

## 2014-01-21 NOTE — Progress Notes (Signed)
Pt called RN to room and c/o being unable to breathe, that her hands were blue and she was having a reaction to dye from CT. Pt's hands were noted to be warm and pink and were no change from morning assessment. O2 sats were checked at 98% with a HR of 82. Pt continued to call the nurse's station multiple times c/o inability to breathe. RN checked the pt 3 more times and listened to pt's lungs. Lungs were a bit tight but no abnormal sounds. MD made aware and came to see pt. New orders entered and implemented. Will continue to monitor. Julio SicksK. Leighanne Adolph RN

## 2014-01-21 NOTE — Progress Notes (Addendum)
Nurse ask me to evaluate patient.  Patient complaining of SOB, feels tightness, feels contrast cause allergic reaction. She denies itching, no hives or redness on physical exam, no stridor, lungs with mild sporadic wheezing. She is speaking full sentences, no swelling of throat or tongue. Patient relates bluish color of hands, no cyanosis in physical exam.  Will give her solumedrol IV.  Extra dose of xanax.  Pepcid.  She refuse albuterol treatments, she refuse benadryl.  Will continue to monitor.   Hartley BarefootBelkys Maui Britten, MD.

## 2014-01-21 NOTE — Progress Notes (Signed)
[facility] PROGRESS NOTE  Judy DingwallJanis Gonzalez ZOX:[id number] DOB: Apr 05, 1946 DOA: 01/19/2014 PCP: Grace IsaacSmith, Lafayette Lyle, MD  Assessment/Plan: 1-COPD with exacerbation  - gets adverse effects from duo nebs. She only use at home albuterol but that make her jittery too.  - Albuterol PRN as need.  -Appreciate Pulmonary evaluation.  -Need to check oxygen concentrator.  -PRN xanax.  -IV solumedrol.  -Patient complaining of dyspnea after contrast. Will give IV solumedrol, Pepcid, will continue to monitor.   2-Bilateral lower extremity edema  - Has significant lower extremity edema that has developed over the past week or so, UA negative for proteinuria, LFTs essentially normal. Albumin normal.  -ECHO with normal EF. Right ventricule with normal function and wall. Pulmonary artery pressure 33.  -Continue with  IV Lasix.  - lower extremity Doppler ultrasound negative for DVT. superficial thrombosis right safenous vein.  - CT abdomen and pelvis : Suspected distal colonic diverticulosis. Prominent vessels in LEFT adnexa cannot exclude pelvic congestion.   3-Prominent vessels in LEFT adnexa ; needs to follow up with GYN.   4-LEFT inguinal region 2.4 x 2.0 cm in size, nonspecific ; this could represent a subcutaneous cyst, small seroma or lymphocele the patient is at prior inguinal surgery or catheterization, less likely a fluid attenuation lymph node. ; follow up with PCP.   Marland Kitchen. Anxiety  - Continue Xanax   . Chronic respiratory failure  - Currently stable, requiring 4 L to maintain O2 saturations.  . History of irritable bowel syndrome  - Stable.   DVT Prophylaxis:  Prophylactic Lovenox   Code Status: Full Code.  Family Communication: care discussed with patient.  Disposition Plan: to be determine.    Consultants:  pulmonary  Procedures:  Doppler  ECHO  Antibiotics:  none  HPI/Subjective: Relates dyspnea after contrast. No rash , no itching. She feels anxious.    Objective: Filed Vitals:   01/21/14 0540  BP: 118/49  Pulse: 83  Temp: 98 F (36.7 C)  Resp: 16    Intake/Output Summary (Last 24 hours) at 01/21/14 1506 Last data filed at 01/21/14 1316  Gross per 24 hour  Intake    360 ml  Output   2850 ml  Net  -2490 ml   Filed Weights   01/19/14 2300  Weight: 45.224 kg (99 lb 11.2 oz)    Exam:   General:  Alert in no distress.   Cardiovascular: S 1, S 2 RRR  Respiratory: no wheezing.   Abdomen: Bs present, distended, NT,   Musculoskeletal: plus 2 edema LE.    Data Reviewed: Basic Metabolic Panel:  Recent Labs Lab 01/19/14 2017 01/20/14 0452 01/21/14 1108  NA 136* 139 140  K 4.3 4.4 3.8  CL 89* 86* 97  CO2 40* >45* 29  GLUCOSE 102* 156* 125*  BUN 9 16 6   CREATININE 0.27* 0.43* 0.56  CALCIUM 9.3 8.9 10.0   Liver Function Tests:  Recent Labs Lab 01/19/14 2017  AST 28  ALT 23  ALKPHOS 67  BILITOT 0.3  PROT 7.0  ALBUMIN 3.5    Recent Labs Lab 01/19/14 2017  LIPASE 26   No results found for this basename: AMMONIA,  in the last 168 hours CBC:  Recent Labs Lab 01/19/14 2016 01/20/14 0452  WBC 6.6 7.9  HGB 10.9* 11.2*  HCT 34.1* 36.4  MCV 98.3 100.3*  PLT 222 220   Cardiac Enzymes: No results found for this basename: CKTOTAL, CKMB, CKMBINDEX, TROPONINI,  in the last 168 hours BNP (last 3  results)  Recent Labs  01/19/14 2017  PROBNP 329.9*   CBG: No results found for this basename: GLUCAP,  in the last 168 hours  No results found for this or any previous visit (from the past 240 hour(s)).   Studies: Dg Chest 2 View (if Patient Has Fever And/or Copd)  01/19/2014   CLINICAL DATA:  Shortness of breath and bilateral leg swelling.  EXAM: CHEST  2 VIEW  COMPARISON:  None.  FINDINGS: The heart is enlarged but stable. There are advanced emphysematous changes and pulmonary scarring but no definite acute overlying pulmonary process. The bony thorax is intact.  IMPRESSION: Chronic emphysematous  changes but no acute pulmonary findings.   Electronically Signed   By: Loralie Champagne M.D.   On: 01/19/2014 20:17   Ct Abdomen Pelvis W Contrast  01/21/2014   CLINICAL DATA:  Mid to lower abdominal pain with abdominal distension, ankle and leg swelling, history COPD, asthma, assess for venous patency or compression  EXAM: CT ABDOMEN AND PELVIS WITH CONTRAST  TECHNIQUE: Multidetector CT imaging of the abdomen and pelvis was performed using the standard protocol following bolus administration of intravenous contrast. Sagittal and coronal MPR images reconstructed from axial data set.  CONTRAST:  80mL OMNIPAQUE IOHEXOL 300 MG/ML SOLN IV. Dilute oral contrast.  COMPARISON:  None  FINDINGS: Emphysematous changes at lung bases without infiltrate.  Small LEFT renal cysts.  Liver, spleen, pancreas, kidneys, and adrenal glands normal appearance.  Scattered atherosclerotic calcifications aorta.  Atrophic uterus with unremarkable ovaries.  Scattered pelvic phleboliths.  Increased number of vessels in the LEFT adnexal region/pelvis at cannot exclude pelvic congestion.  Small fluid collection at LEFT inguinal region, nonspecific, 2.4 x 2.0 cm image 69.  Probable mild distal colonic diverticulosis.  Distal small bowel and colon are unopacified by contrast limiting assessment, no additional gross abnormality seen.  Stomach and remaining small bowel loops normal appearance.  No additional mass, adenopathy, free fluid, or free air.  No acute osseous findings.  IMPRESSION: COPD changes at lung bases.  Suspected distal colonic diverticulosis.  Prominent vessels in LEFT adnexa cannot exclude pelvic congestion.  Small nonspecific fluid collection at the LEFT inguinal region 2.4 x 2.0 cm in size, nonspecific ; this could represent a subcutaneous cyst, small seroma or lymphocele the patient is at prior inguinal surgery or catheterization, less likely a fluid attenuation lymph node.  No other significant intra-abdominal or intrapelvic  findings identified.   Electronically Signed   By: Ulyses Southward M.D.   On: 01/21/2014 12:23   Dg Abd 2 Views  01/20/2014   CLINICAL DATA:  Abdominal pain and distention  EXAM: ABDOMEN - 2 VIEW  COMPARISON:  None.  FINDINGS: There is no free intraperitoneal gas. No disproportionate dilatation of bowel. Phleboliths project over the pelvis.  IMPRESSION: Nonobstructive bowel gas pattern.   Electronically Signed   By: Maryclare Bean M.D.   On: 01/20/2014 07:56    Scheduled Meds: . ALPRAZolam  0.25 mg Oral Q6H  . B-complex with vitamin C  1 tablet Oral Daily  . budesonide-formoterol  2 puff Inhalation BID  . cholecalciferol  1,000 Units Oral BID  . enoxaparin (LOVENOX) injection  40 mg Subcutaneous QHS  . famotidine (PEPCID) IV  20 mg Intravenous Q24H  . folic acid-pyridoxine-cyancobalamin  1 tablet Oral Daily  . furosemide  40 mg Intravenous Daily  . lactobacillus acidophilus  2 tablet Oral TID  . loratadine  10 mg Oral Daily  . methylPREDNISolone (SOLU-MEDROL) injection  60  mg Intravenous Daily  . nystatin  5 mL Oral TID  . potassium chloride  20 mEq Oral Daily  . sodium chloride  3 mL Intravenous Q12H   Continuous Infusions:   Principal Problem:   COPD with exacerbation Active Problems:   Anxiety   Chronic respiratory failure   Bilateral lower extremity edema   IBS (irritable bowel syndrome)    Time spent: 25 minutes.     Hartley Barefoot A  [facility] Pager [phone]. If 7PM-7AM, please contact night-coverage at [email or web address], password [facility] 01/21/2014, 3:06 PM  LOS: 2 days

## 2014-01-21 NOTE — Progress Notes (Signed)
Patient refuses to have humidifier placed on oxygen at 5lpm. Dr. Sunnie Nielsenegalado in room.

## 2014-01-21 NOTE — Telephone Encounter (Signed)
RB pt is currently admitted to the hospital.  Pt is requesting that we send in an order for the pt to [facility]  She feels that her current one is not putting out enough.  RA send in [facility] she is d/c home.  Please advise. thanks
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
## 2014-01-21 NOTE — Consult Note (Signed)
Name: Judy Gonzalez MRN: [id number] DOB: 19-Mar-1946    ADMISSION DATE:  01/19/2014 CONSULTATION DATE:  7/27  REFERRING MD :  [facility] PRIMARY SERVICE: [facility], Delton Coombes Pulmonary MD  CHIEF COMPLAINT:  Oxygen deprivation  BRIEF PATIENT DESCRIPTION:  68 yo WF, former smoker, admitted with increased dyspnea, increased lower ext edema and malfunctioning O2 concentrators.   SIGNIFICANT EVENTS / STUDIES:  7/26 admitted 7/26 steroids  LINES / TUBES:   CULTURES:   ANTIBIOTICS:   HISTORY OF PRESENT ILLNESS:  68 yo WF, former smoker, admitted with increased dyspnea, increased lower ext edema and malfunctioning O2 concentrators on 7/26. She feels that her home concentrators (5) have all malfunctioned and she has declined over the last 18 months due to over long O2 tubing(50 feet) that is delivering inadequate amounts of O2. She reports using cylinder O2 tanks 16 in 4 days and they are also malfunctioning.  She does have increased lower ext edema , treated with lasix on admit and she is requesting increased doses of xanax. PCCM asked to evaluate.   SUBJECTIVE:  Still adamant that home health care has done a poor job with her concentrator and that is the cause of her decline in health.  VITAL SIGNS: Temp:  [98 F (36.7 C)-98.3 F (36.8 C)] 98 F (36.7 C) (07/28 0540) Pulse Rate:  [83-94] 83 (07/28 0540) Resp:  [16-20] 16 (07/28 0540) BP: (97-118)/(35-57) 118/49 mmHg (07/28 0540) SpO2:  [96 %-99 %] 99 % (07/28 0836)  PHYSICAL EXAMINATION: General:  Frail, cachetic . Anxious WF NAD at rest Neuro: Intact but suspect some element of confusion.  HEENT:  No JVD/LAN Cardiovascular:  HSR RRR Lungs:  Decreased air movement, no wheezes, kyphotic Abdomen: Soft +bs Musculoskeletal:  Intact Skin:  2 + lower ext pitting edema   Recent Labs Lab 01/19/14 2017 01/20/14 0452  NA 136* 139  K 4.3 4.4  CL 89* 86*  CO2 40* >45*  BUN 9 16  CREATININE 0.27* 0.43*  GLUCOSE 102* 156*     Recent Labs Lab 01/19/14 2016 01/20/14 0452  HGB 10.9* 11.2*  HCT 34.1* 36.4  WBC 6.6 7.9  PLT 222 220   Dg Chest 2 View (if Patient Has Fever And/or Copd)  01/19/2014   CLINICAL DATA:  Shortness of breath and bilateral leg swelling.  EXAM: CHEST  2 VIEW  COMPARISON:  None.  FINDINGS: The heart is enlarged but stable. There are advanced emphysematous changes and pulmonary scarring but no definite acute overlying pulmonary process. The bony thorax is intact.  IMPRESSION: Chronic emphysematous changes but no acute pulmonary findings.   Electronically Signed   By: Loralie Champagne M.D.   On: 01/19/2014 20:17   Dg Abd 2 Views  01/20/2014   CLINICAL DATA:  Abdominal pain and distention  EXAM: ABDOMEN - 2 VIEW  COMPARISON:  None.  FINDINGS: There is no free intraperitoneal gas. No disproportionate dilatation of bowel. Phleboliths project over the pelvis.  IMPRESSION: Nonobstructive bowel gas pattern.   Electronically Signed   By: Maryclare Bean M.D.   On: 01/20/2014 07:56    ASSESSMENT / PLAN:  Acute on chronic hypoxic hypoxia respiratory failure with component of volume overload. She reports a confused summary of multiple o2 CONCENTRATOR MALFUNCTIONS that have caused her decline over the last 18 months. When all facts are examined her main request is for increased anxiolytics.   Plan: Agree with current treatment\Consider dc steroids as this may increase her confusion and anxiety.  Ambulated in hall on  O2 4lnc and desaturated  to 83% consistent with severe progressive dz process.  Increase xanax as needed  Consider hospice in future. Dr. Delton CoombesByrum has begun this conversation.  Please contact [facility] to ensure O2 concentrator is adjuisted, set up for her prior to discharge  O2 needs on exertion will have to be clarified -may need 6 -8 l/m  ALso, ask [facility] to check her at home & report back after dc, I have asked her to shorten the length of her cord to 25 ft  PCCM to sign off  Cyril Mourningakesh  Alva MD. Tonny BollmanFCCP. [facility] Pager [phone] If no response call [phone]

## 2014-01-22 LAB — BASIC METABOLIC PANEL
ANION GAP: 5 (ref 5–15)
BUN: 15 mg/dL (ref 6–23)
CO2: 43 mEq/L (ref 19–32)
Calcium: 8.9 mg/dL (ref 8.4–10.5)
Chloride: 87 mEq/L — ABNORMAL LOW (ref 96–112)
Creatinine, Ser: 0.42 mg/dL — ABNORMAL LOW (ref 0.50–1.10)
Glucose, Bld: 105 mg/dL — ABNORMAL HIGH (ref 70–99)
POTASSIUM: 4.5 meq/L (ref 3.7–5.3)
Sodium: 135 mEq/L — ABNORMAL LOW (ref 137–147)

## 2014-01-22 MED ORDER — LACTINEX PO CHEW
2.0000 | CHEWABLE_TABLET | Freq: Three times a day (TID) | ORAL | Status: DC
  Administered 2014-01-22 – 2014-01-23 (×2): 2 via ORAL
  Filled 2014-01-22 (×4): qty 2

## 2014-01-22 MED ORDER — ALPRAZOLAM 0.25 MG PO TABS
0.2500 mg | ORAL_TABLET | Freq: Once | ORAL | Status: AC
  Administered 2014-01-22: 0.25 mg via ORAL
  Filled 2014-01-22: qty 1

## 2014-01-22 MED ORDER — PREDNISONE 50 MG PO TABS
50.0000 mg | ORAL_TABLET | Freq: Every day | ORAL | Status: DC
  Administered 2014-01-22 – 2014-01-23 (×2): 50 mg via ORAL
  Filled 2014-01-22 (×3): qty 1

## 2014-01-22 MED ORDER — FAMOTIDINE 20 MG PO TABS
20.0000 mg | ORAL_TABLET | Freq: Every day | ORAL | Status: DC
  Filled 2014-01-22: qty 1

## 2014-01-22 MED ORDER — PREDNISONE 50 MG PO TABS
50.0000 mg | ORAL_TABLET | Freq: Every day | ORAL | Status: DC
  Filled 2014-01-22: qty 1

## 2014-01-22 NOTE — Progress Notes (Signed)
Pt called RN into room c/o redness around NSL IV. RN assessed IV and explained that the redness did not look like it was related to the IV. Pt stated she was very sure it was coming from the IV and demanded it be removed. When RN explained that she would need another IV if that one were to be removed, pt stated that she was not hydrated well enough to have another IV started on her at this time and that it would have to wait until later in the morning. IV team was notified of this. Will continue to monitor the pt closely. Mardene CelesteAsaro, Qunisha Bryk I

## 2014-01-22 NOTE — Progress Notes (Addendum)
INITIAL NUTRITION ASSESSMENT  Patient meets the criteria for severe MALNUTRITION in the context of chronic illness with 31% weight loss in 1.5 years and severe muscle and subcutaneous fat depletion.   DOCUMENTATION CODES Per approved criteria  -Severe malnutrition in the context of chronic illness -Underweight   INTERVENTION: -Change diet from heart healthy to 2 gram sodium to allow more food options for patient/increase caloric intake. -Encourage intake of meals and snacks.   NUTRITION DIAGNOSIS: Increased nutrient needs related to COPD as evidenced by difficulty maintaining/gaining weight.   Goal: Patient will meet >/=90% of estimated nutrition needs  Monitor:  PO intake, weight, labs  Reason for Assessment: Low BMI  68 y.o. female  Admitting Dx: COPD with exacerbation  ASSESSMENT: 68 year old female with history of COPD, chronic respiratory failure on home oxygen, presents with worsening shortness of breath for the past 2-3 days and bilateral lower extremity edema for 7 days.   Patient reports that she has a good appetite and is eating very well (100% intake of meals). However, due to increased energy needs of breathing with COPD, she has difficulty maintaining/gaining weight. She also has IBS, and follows a gluten-free, dairy-free, and sugar-free diet. She does eat a lot of protein foods, fruits, and vegetables. Her weight is down 10 pounds (10%) from last September and 40 pounds (31%) in 1.5 years, but she has gained 5 pounds recently. Limited nutrition focused physical exam performed.   Patient complains of limited menu options she can eat. Spoke with MD regarding changing diet to just a 2 g sodium restriction to expand options and increase calorie intake. Patient declining nutrition supplements due to personal dietary restrictions.   Nutrition Focused Physical Exam:  Subcutaneous Fat:  Orbital Region: severe depletion Upper Arm Region: severe depletion Thoracic and  Lumbar Region: N/A  Muscle:  Temple Region: severe depletion Clavicle Bone Region: severe depletion Clavicle and Acromion Bone Region: severe depletion Scapular Bone Region: N/A Dorsal Hand: N/A Patellar Region: severe depletion Anterior Thigh Region: severe depletion Posterior Calf Region: severe depletion  Edema: 2+ pitting edema    Height: Ht Readings from Last 1 Encounters:  01/19/14 5\' 6"  (1.676 m)    Weight: Wt Readings from Last 1 Encounters:  01/22/14 95 lb (43.092 kg)    Ideal Body Weight: 130 pounds  % Ideal Body Weight: 73%  Wt Readings from Last 10 Encounters:  01/22/14 95 lb (43.092 kg)  11/15/13 91 lb 6.4 oz (41.459 kg)  03/22/13 105 lb 6.4 oz (47.809 kg)  04/30/12 136 lb 8 oz (61.916 kg)  01/30/12 136 lb (61.689 kg)  10/28/11 136 lb 6.4 oz (61.871 kg)  09/09/11 137 lb (62.143 kg)  03/25/11 143 lb 6.4 oz (65.046 kg)  11/19/10 135 lb 6.4 oz (61.417 kg)  10/22/10 139 lb (63.05 kg)    Usual Body Weight: 105 pounds  % Usual Body Weight: 10%  BMI:  Body mass index is 15.34 kg/(m^2). Patient is underweight.  Estimated Nutritional Needs: Kcal: 1350-1500 kcal Protein: 70-80 g Fluid: >1.3 L/day  Skin: 2+ pitting edema  Diet Order: Cardiac  EDUCATION NEEDS: -No education needs identified at this time   Intake/Output Summary (Last 24 hours) at 01/22/14 1341 Last data filed at 01/22/14 1000  Gross per 24 hour  Intake      0 ml  Output   1250 ml  Net  -1250 ml    Last BM: 7/28   Labs:   Recent Labs Lab 01/20/14 0452 01/21/14 1108 01/22/14  0452  NA 139 140 135*  K 4.4 3.8 4.5  CL 86* 97 87*  CO2 >45* 29 43*  BUN 16 6 15   CREATININE 0.43* 0.56 0.42*  CALCIUM 8.9 10.0 8.9  GLUCOSE 156* 125* 105*    CBG (last 3)  No results found for this basename: GLUCAP,  in the last 72 hours  Scheduled Meds: . ALPRAZolam  0.25 mg Oral Q6H  . B-complex with vitamin C  1 tablet Oral Daily  . budesonide-formoterol  2 puff Inhalation BID  .  cholecalciferol  1,000 Units Oral BID  . enoxaparin (LOVENOX) injection  40 mg Subcutaneous QHS  . famotidine (PEPCID) IV  20 mg Intravenous Q24H  . folic acid-pyridoxine-cyancobalamin  1 tablet Oral Daily  . furosemide  40 mg Intravenous Daily  . lactobacillus acidophilus  2 tablet Oral TID  . loratadine  10 mg Oral QHS  . methylPREDNISolone (SOLU-MEDROL) injection  60 mg Intravenous Daily  . nystatin  5 mL Oral TID  . potassium chloride  20 mEq Oral Daily  . sodium chloride  3 mL Intravenous Q12H    Continuous Infusions:   Past Medical History  Diagnosis Date  . Allergic rhinitis   . Asthma   . Abnormal heart rhythm   . COPD (chronic obstructive pulmonary disease)     Past Surgical History  Procedure Laterality Date  . Breast surgery    . Parathyroidectomy      Linnell Fulling, RD, LDN Pager #: [phone] After-Hours Pager #: [phone]

## 2014-01-22 NOTE — Progress Notes (Signed)
CARE MANAGEMENT NOTE 01/22/2014  Patient:  Harland DingwallCHILDRESS,Victorian   Account Number:  [id number]  Date Initiated:  01/22/2014  Documentation initiated by:  Ezekiel InaMcGIBBONEY,COOKIE  Subjective/Objective Assessment:   Pt admitted with cco SOB, edema, COPD     Action/Plan:   from home with O2   Anticipated DC Date:  01/23/2014   Anticipated DC Plan:  HOME/SELF CARE      DC Planning Services  CM consult      Choice offered to / List presented to:             Status of service:  In process, will continue to follow Medicare Important Message given?  YES (If response is "NO", the following Medicare IM given date fields will be blank) Date Medicare IM given:  01/22/2014 Medicare IM given by:  Ezekiel InaMcGIBBONEY,COOKIE Date Additional Medicare IM given:   Additional Medicare IM given by:    Discharge Disposition:    Per UR Regulation:  Reviewed for med. necessity/level of care/duration of stay  If discussed at Long Length of Stay Meetings, dates discussed:    Comments:  7/29/15MMCGIBBONEY, RN, BSN Spoke with pt in length concerning Home O2, Concentrator and [facility].  Pt wants a higher (12 L) concentrator related to her not getting a strong flow of O2. Pt states that she has had [facility] change out the concentrator before and she later purchased a concentrator. Pt is on 5 L of O2 at present time.  A call to three different home health agencies revealed,  home O2 concentrators are up to 10 L only.  This information was given to the pt.  [facility]

## 2014-01-22 NOTE — Progress Notes (Signed)
[facility] PROGRESS NOTE  Judy DingwallJanis Gonzalez ZOX:[id number] DOB: 04-13-1946 DOA: 01/19/2014 PCP: Judy Gonzalez, Judy Lyle, MD  Assessment/Plan: 68 year old admitted with COPD exacerbation and worsening LE edema.   1-COPD with exacerbation  - gets adverse effects from duo nebs. She only use at home albuterol but that make her jittery too.  - Albuterol PRN as need. Patient relates allergy to albuterol.  -Appreciate Pulmonary evaluation.  -Need to check oxygen concentrator. I have order home oxygen 5 L. Will check oxygen on ambulation.  -PRN xanax.  -Patient complaint of dyspnea after contrast on 7-28. Received  IV solumedrol, Pepcid, doing better.  -probably dyspnea was related to COPD, will  Change IV solumedrol to prednisone.   2-Bilateral lower extremity edema  - Has significant lower extremity edema that has developed over the past week or so, UA negative for proteinuria, LFTs essentially normal. Albumin normal.  -ECHO with normal EF. Right ventricule with normal function and wall. Pulmonary artery pressure 33.  -received IV lasix. Edema significantly decreased. Continue with compression stocking.  -Hold on lasix today due to decrease sodium, and chloride. Might need PRN oral lasix at discharge for edema.   - lower extremity Doppler ultrasound negative for DVT. superficial thrombosis right safenous vein.  - CT abdomen and pelvis : Suspected distal colonic diverticulosis. Prominent vessels in LEFT adnexa cannot exclude pelvic congestion.   3-Prominent vessels in LEFT adnexa ; needs to follow up with GYN.   4-LEFT inguinal region 2.4 x 2.0 cm in size, nonspecific ; this could represent a subcutaneous cyst, small seroma or lymphocele the patient is at prior inguinal surgery or catheterization, less likely a fluid attenuation lymph node. ; follow up with PCP.   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 Need repeat doppler in 2 weeks.   .  Anxiety  - Continue Xanax   . Chronic respiratory failure  - Currently stable, requiring 5 L to maintain O2 saturations. -CM helping with oxygen arrangement.   . History of irritable bowel syndrome  - Stable.   DVT Prophylaxis:  Prophylactic Lovenox   Code Status: Full Code.  Family Communication: care discussed with patient.  Disposition Plan: home in 24 to 48 hours.    Consultants:  pulmonary  Procedures: Doppler: No evidence of deep vein thrombosis involving the right lower extremity and left lower extremity. 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   ECHO: Left ventricle: The cavity size was normal. Systolic function was normal. The estimated ejection fraction was in the range of 55% to 60%. Wall motion was normal; there were no regional wall motion abnormalities. - Mitral valve: There was trivial regurgitation. - Tricuspid valve: There was trivial regurgitation. - Pulmonary arteries: PA peak pressure: 33 mm Hg (S).    Antibiotics:  none  HPI/Subjective: She feels she is not ready, still with some SOB. Swelling has decreased significantly.  She continue to think that contrast cause allergic reaction. Relates that she has some redness were she had the IV access.  Objective: Filed Vitals:   01/22/14 0528  BP: 143/62  Pulse: 99  Temp: 97.9 F (36.6 C)  Resp: 24    Intake/Output Summary (Last 24 hours) at 01/22/14 1416 Last data filed at 01/22/14 1000  Gross per 24 hour  Intake      0 ml  Output   1250 ml  Net  -1250 ml   Filed Weights   01/19/14 2300 01/21/14 1811  01/22/14 0528  Weight: 45.224 kg (99 lb 11.2 oz) 43.092 kg (95 lb) 43.092 kg (95 lb)    Exam:   General:  Alert in no distress.   Cardiovascular: S 1, S 2 RRR  Respiratory: no wheezing.   Abdomen: Bs present, distended, NT,   Musculoskeletal: edema LE significantly decreased.   Data Reviewed: Basic Metabolic Panel:  Recent Labs Lab  01/19/14 2017 01/20/14 0452 01/21/14 1108 01/22/14 0452  NA 136* 139 140 135*  K 4.3 4.4 3.8 4.5  CL 89* 86* 97 87*  CO2 40* >45* 29 43*  GLUCOSE 102* 156* 125* 105*  BUN 9 16 6 15   CREATININE 0.27* 0.43* 0.56 0.42*  CALCIUM 9.3 8.9 10.0 8.9   Liver Function Tests:  Recent Labs Lab 01/19/14 2017  AST 28  ALT 23  ALKPHOS 67  BILITOT 0.3  PROT 7.0  ALBUMIN 3.5    Recent Labs Lab 01/19/14 2017  LIPASE 26   No results found for this basename: AMMONIA,  in the last 168 hours CBC:  Recent Labs Lab 01/19/14 2016 01/20/14 0452  WBC 6.6 7.9  HGB 10.9* 11.2*  HCT 34.1* 36.4  MCV 98.3 100.3*  PLT 222 220   Cardiac Enzymes: No results found for this basename: CKTOTAL, CKMB, CKMBINDEX, TROPONINI,  in the last 168 hours BNP (last 3 results)  Recent Labs  01/19/14 2017  PROBNP 329.9*   CBG: No results found for this basename: GLUCAP,  in the last 168 hours  No results found for this or any previous visit (from the past 240 hour(s)).   Studies: Ct Abdomen Pelvis W Contrast  01/21/2014   CLINICAL DATA:  Mid to lower abdominal pain with abdominal distension, ankle and leg swelling, history COPD, asthma, assess for venous patency or compression  EXAM: CT ABDOMEN AND PELVIS WITH CONTRAST  TECHNIQUE: Multidetector CT imaging of the abdomen and pelvis was performed using the standard protocol following bolus administration of intravenous contrast. Sagittal and coronal MPR images reconstructed from axial data set.  CONTRAST:  80mL OMNIPAQUE IOHEXOL 300 MG/ML SOLN IV. Dilute oral contrast.  COMPARISON:  None  FINDINGS: Emphysematous changes at lung bases without infiltrate.  Small LEFT renal cysts.  Liver, spleen, pancreas, kidneys, and adrenal glands normal appearance.  Scattered atherosclerotic calcifications aorta.  Atrophic uterus with unremarkable ovaries.  Scattered pelvic phleboliths.  Increased number of vessels in the LEFT adnexal region/pelvis at cannot exclude pelvic  congestion.  Small fluid collection at LEFT inguinal region, nonspecific, 2.4 x 2.0 cm image 69.  Probable mild distal colonic diverticulosis.  Distal small bowel and colon are unopacified by contrast limiting assessment, no additional gross abnormality seen.  Stomach and remaining small bowel loops normal appearance.  No additional mass, adenopathy, free fluid, or free air.  No acute osseous findings.  IMPRESSION: COPD changes at lung bases.  Suspected distal colonic diverticulosis.  Prominent vessels in LEFT adnexa cannot exclude pelvic congestion.  Small nonspecific fluid collection at the LEFT inguinal region 2.4 x 2.0 cm in size, nonspecific ; this could represent a subcutaneous cyst, small seroma or lymphocele the patient is at prior inguinal surgery or catheterization, less likely a fluid attenuation lymph node.  No other significant intra-abdominal or intrapelvic findings identified.   Electronically Signed   By: Ulyses Southward M.D.   On: 01/21/2014 12:23    Scheduled Meds: . ALPRAZolam  0.25 mg Oral Q6H  . B-complex with vitamin C  1 tablet Oral Daily  . budesonide-formoterol  2 puff Inhalation BID  . cholecalciferol  1,000 Units Oral BID  . enoxaparin (LOVENOX) injection  40 mg Subcutaneous QHS  . [START ON 01/23/2014] famotidine  20 mg Oral Daily  . folic acid-pyridoxine-cyancobalamin  1 tablet Oral Daily  . lactobacillus acidophilus  2 tablet Oral TID  . loratadine  10 mg Oral QHS  . nystatin  5 mL Oral TID  . potassium chloride  20 mEq Oral Daily  . [START ON 01/23/2014] predniSONE  50 mg Oral Q breakfast  . sodium chloride  3 mL Intravenous Q12H   Continuous Infusions:   Principal Problem:   COPD with exacerbation Active Problems:   Anxiety   Chronic respiratory failure   Bilateral lower extremity edema   IBS (irritable bowel syndrome)    Time spent: 25 minutes.     Hartley Barefoot A  [facility] Pager [phone]. If 7PM-7AM, please contact night-coverage at  [email or web address], password [facility] 01/22/2014, 2:16 PM  LOS: 3 days

## 2014-01-22 NOTE — Progress Notes (Signed)
Patient refuses to ambulate with oxygen at this time. Patient is complaining of increasing shortness of breath with activity. Oxygen saturation is 100% on 5 liters at rest. Will continue to monitor patient.

## 2014-01-23 LAB — BASIC METABOLIC PANEL
ANION GAP: 11 (ref 5–15)
BUN: 17 mg/dL (ref 6–23)
CALCIUM: 9.3 mg/dL (ref 8.4–10.5)
CO2: 37 meq/L — AB (ref 19–32)
Chloride: 89 mEq/L — ABNORMAL LOW (ref 96–112)
Creatinine, Ser: 0.25 mg/dL — ABNORMAL LOW (ref 0.50–1.10)
GFR calc Af Amer: >90 mL/min (ref >90)
Glucose, Bld: 114 mg/dL — ABNORMAL HIGH (ref 70–99)
Potassium: 4.8 mEq/L (ref 3.7–5.3)
Sodium: 137 mEq/L (ref 137–147)

## 2014-01-23 MED ORDER — FAMOTIDINE 20 MG PO TABS: 20.0000 mg | ORAL_TABLET | Freq: Every day | ORAL | Status: AC

## 2014-01-23 MED ORDER — NYSTATIN 100000 UNIT/ML MT SUSP: 5.0000 mL | Freq: Three times a day (TID) | OROMUCOSAL | Status: AC

## 2014-01-23 MED ORDER — PREDNISONE 10 MG PO TABS: ORAL_TABLET | ORAL | Status: AC

## 2014-01-23 NOTE — Telephone Encounter (Signed)
[facility]

## 2014-01-23 NOTE — Progress Notes (Signed)
Pt ambulated in hallway tonight on 6L of O2 [place].  Pt's O2 sat was 91%.  Pt aware of limitations, returned to room.  Pt's O2 sat at rest 98% on 6L of O2 [place]
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
## 2014-01-23 NOTE — Telephone Encounter (Signed)
Order placed. thanks

## 2014-01-23 NOTE — Progress Notes (Signed)
Pt declined Home Health RN.

## 2014-01-23 NOTE — Progress Notes (Signed)
Was informed by the nurse tech that the patient increased her Oxygen level to 9 L/min. This RN informed the patient that she is not allowed to increase or decrease her oxygen, only RT and nursing with a doctor's order are allowed to change her oxygen level. The patient stated that she was not getting "enough" through the O2 extension tubing and that she need's an extra liter or 2 of O2 with exertion. Offered the patient to shorten the tubing and [facility] the [facility] to the bedside, but the patient refused. At this time this RN changed the Oxygen level back to 6 L/min and checked the patient's Oxygen sat-100% on 6L. This RN told the patient to discuss her need to increase the oxygen with the doctor in the morning. Pt not in respiratory distress. Will continue to monitor closely.

## 2014-01-23 NOTE — Progress Notes (Signed)
[facility]   [facility] the [facility] 10 lpm concentrator  If patient discharges after hours, please call [phone].   Renard HamperLecretia Williamson 01/23/2014, 10:17 AM

## 2014-01-23 NOTE — Telephone Encounter (Signed)
[facility] to make sure it is working properly to [facility]. Rhonda J Cobb

## 2014-01-27 NOTE — Telephone Encounter (Signed)
[facility] Consulted with Dr Vassie LollAlva while admitted--[facility] O2 concentrators in patient home to ensure functionality. Pt D/C home 01/23/14   lmtcb x 1 to see if [facility]
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
## 2014-01-27 NOTE — Telephone Encounter (Signed)
Thank yo u- please let me know once they have been checked

## 2014-01-28 ENCOUNTER — Telehealth: Payer: Self-pay | Admitting: Emergency Medicine
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
## 2014-01-28 NOTE — Telephone Encounter (Signed)
Clear to me that much of her problem is psychosocial, not related to [facility] and will need to be addressed somehow - through psych referral and help if possible. Regardless, [facility], so first thing to do here is to refer her to an alternative. If she has a preferred company then refer to them, otherwise refer to [facility] or [facility].

## 2014-01-28 NOTE — Telephone Encounter (Signed)
Spoke with patient-she is aware of suggestion from RB to change DME companies. She will look into [facility] and [facility] prior to choosing a new DME. Pt will call us back once she has picked company for us to send referral.  I have not spoken with patient about a referral to psych as I do not feel comfortable speaking to patient about this matter as she does not seem stable enough over the phone and do not know the patient well enough. Will forward to RB to have this conversation with the patient.

## 2014-01-28 NOTE — Telephone Encounter (Signed)
I will discuss her psych referral at next OV. For now get her a new DME company

## 2014-01-29 ENCOUNTER — Telehealth: Payer: Self-pay | Admitting: Emergency Medicine

## 2014-01-29 DIAGNOSIS — J441 Chronic obstructive pulmonary disease with (acute) exacerbation: Secondary | ICD-10-CM

## 2014-01-29 NOTE — Telephone Encounter (Signed)
Pt states she will call back later. She has other things that she has to get to today.

## 2014-01-29 NOTE — Telephone Encounter (Signed)
Marchelle FolksAmanda with [employer] in calling about patient  [phone]

## 2014-01-29 NOTE — Telephone Encounter (Signed)
Called pt and LMTCB x1 

## 2014-01-29 NOTE — Telephone Encounter (Signed)
Patient returning call.  [phone]

## 2014-01-29 NOTE — Telephone Encounter (Signed)
Called spoke with Marchelle FolksAmanda from [facility]. She reports they call pt to acknowledge they received pt order for change in DME and her order will be sent to w-s office. They LMTCB for her. Pt called back and spoke with Robynn PaneElise from [facility]. She was rude to HurstElise and advised her she did not make the authorization to change DME's. Pt hung up and called [facility] back. Marchelle Folksmanda spoke with her then. She advised Marchelle Folksmanda that "shame on RB for changing her dme's and don't he know [place] still [place] free [place]. She can choose w/e DME she wants to use".   Pt only has 2 options for DME left: [facility] and [facility]. Which both companies are owned by [facility].   Will await pt return call as stated below she will call when it is convenient for her
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
## 2014-01-29 NOTE — Telephone Encounter (Signed)
Called spoke with Melissa as well from [facility] to see if they can go ahead and call pt and make her aware she is being d/c'd instead of awaiting d/c letter.  Efraim KaufmannMelissa is going to call the Data processing managerbranch manager and see what can be done.

## 2014-01-29 NOTE — Telephone Encounter (Addendum)
See phone note 01/28/14 Per Dr Delton CoombesByrum, do not discuss referral to Pysch with patient over the phone-- Dr Delton CoombesByrum will mention and discuss with the patient at her upcoming OV. Leslye Peerobert S Byrum, MD at 01/28/2014 5:30 PM     Status: Signed        I will discuss her psych referral at next OV. For now get her a new DME company  Order placed for DME Switch  Called patient to speak with her regarding switching her to a new DME-- [facility] will choose a DME company that her insurance will take (pt has already used a few different DME's) Pt was very rude on the phone and said that she was busy and did not have time to talk. Pt stated that she would call back when it was more convenient for her.
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
## 2014-01-31 NOTE — Telephone Encounter (Signed)
I don't know what else to say here. I only changed Ms Judy Gonzalez to an alternative DME because [facility] is firing her. I think [facility] and [facility] are both good choices for her, but she has alienated [facility]. I think that Ms Judy Gonzalez may need to be referred to Patient Advocacy at [facility] to see if they can address some of her concerns. Clearly she believes that she has been wronged, has received poor care. I do not believe this is the case, but she should have a forum to address and air her concerns. I will speak to her when I return and see if she wants to continue to see me in office. If she wants me to refer her to an new MD then I will do so. In meantime, I will send this message to Ms Judy Gonzalez to see if there is a standardized protocol to allow Ms Judy Gonzalez to speak to the patient advocates

## 2014-02-03 NOTE — Telephone Encounter (Signed)
lmomtcb x1 for carrie 

## 2014-02-03 NOTE — Telephone Encounter (Signed)
Pt called, stating that she was told to call and ask for triage today Spoke with patient who reported that she spoke with RB on 8.8.15 and he informed her to obtain the part number for the O2 system she would like and the fax number to the DME company she would like to switch to.  No documentation supporting this conversation in epic.  Order # [id number] Fax # to [facility] [phone]  Pt would also like RB to be aware that she will be buying her "accessories" >> "tubing, cannulas, regulators".  She will be renting the O2 device.  Advised pt that we have been trying to contact [employer] but keep playing "phone tag."  Pt is aware we are working on this and denies needing anything further at this time.  Will continue to [facility] to get in touch with [facility]/ [employer]

## 2014-02-03 NOTE — Telephone Encounter (Signed)
Lyla SonCarrie returned call.  Antionette FairyHolly D Pryor

## 2014-02-03 NOTE — Telephone Encounter (Signed)
Judy Gonzalez returned call - [phone]

## 2014-02-03 NOTE — Telephone Encounter (Signed)
LMOMTCB x 1 

## 2014-02-03 NOTE — Telephone Encounter (Signed)
Lyla SonCarrie called back and she stated that they will need something faxed to her ATTN--carrie  At fax #  [phone]--pt is trying to purchase a stationary concentrator and they will need this order before they can allow the pt to purchase this and ship it out to her.  Order has been placed.

## 2014-02-03 NOTE — Telephone Encounter (Signed)
Lyla SonCarrie calling back [phone]

## 2014-02-03 NOTE — Telephone Encounter (Signed)
LMOM TCB x1 for [place].

## 2014-02-03 NOTE — Telephone Encounter (Signed)
Carrie with [facility] states pt is requesting them to provide her stationary O2.  Lyla SonCarrie can be reached at [phone].  Antionette FairyHolly D Pryor

## 2014-02-07 ENCOUNTER — Telehealth: Payer: Self-pay | Admitting: Emergency Medicine

## 2014-02-07 NOTE — Telephone Encounter (Signed)
fyi to RB

## 2014-02-07 NOTE — Telephone Encounter (Signed)
[facility] [phone]) Yesterday around 4212. Pt came into store and was very belligerent, cursing everyone, and calling them liars. Pt was also very rude to her on care giver who appologized repatly. Pt refused to leave store until she got more o2 tanks. Pt left store with 24 tanks, 14 new and 10 empty tanks. She also brought no empty tanks for return. She was told that this could not be done again. The patients discharge letter has been sent and she should be getting it shortly. No need to call Judy Gonzalez back unless needed.

## 2014-02-10 NOTE — Telephone Encounter (Signed)
Thank you :)

## 2014-02-21 ENCOUNTER — Telehealth: Payer: Self-pay | Admitting: Emergency Medicine

## 2014-02-21 NOTE — Telephone Encounter (Signed)
Spoke with Melissa w/ [facility] - discharge letter was sent.  There is a 30 day window for them to continue providing care, which is up on 9.4.15.  Does pt have another DME company?  She will need to be set up w/ new DME company by 9.3.15 so that they can pick up their equipment and pt not suffer a lapse in care.  Per the 8.5.15 phone note, pt wanted to be switched to [facility].  ATC pt x1 - line rang numerous times with no answer and no option to LM.  WCB.

## 2014-02-21 NOTE — Telephone Encounter (Signed)
LMOMTCB x 1 

## 2014-02-24 NOTE — Telephone Encounter (Signed)
ATC pt  - line rang numerous time with no answer and no option to LM. WCB

## 2014-02-24 NOTE — Telephone Encounter (Signed)
ATC x2 No answer, continuous ringing  No option to leave vmail.  wcb

## 2014-02-26 NOTE — Telephone Encounter (Signed)
Pt has expired. Will sign off on message.

## 2014-03-07 ENCOUNTER — Ambulatory Visit: Payer: Medicare Other | Admitting: Emergency Medicine

## 2014-03-10 ENCOUNTER — Telehealth: Payer: Self-pay

## 2014-03-10 NOTE — Telephone Encounter (Signed)
We received Death Certificate 16/03/9603 for Dr. Delton Coombes to sign. Mailed back to funeral home 03/07/2014

## 2014-03-27 DEATH — deceased

## 2015-04-08 IMAGING — CR DG CHEST 2V
3 series · 3 of 3 positions shown · non-contrast
Comparison: None.

CLINICAL DATA: Shortness of breath and bilateral leg swelling.

EXAM:
CHEST  2 VIEW

[w chest pa]
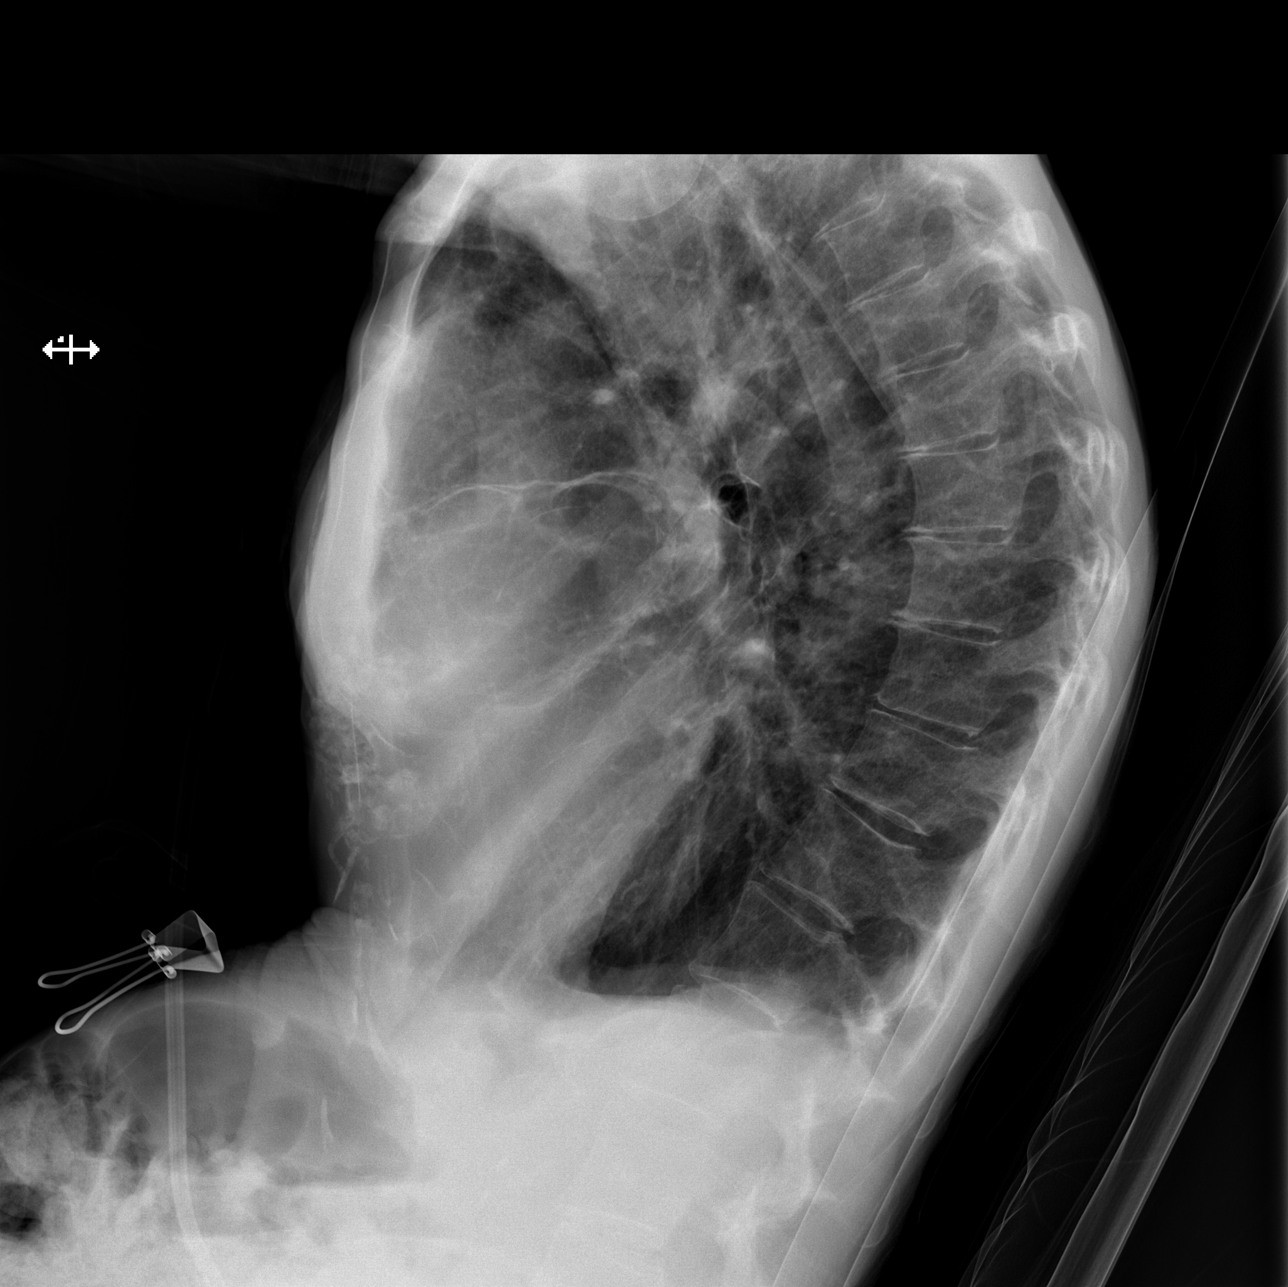

[x chest ap (1 of 2)]
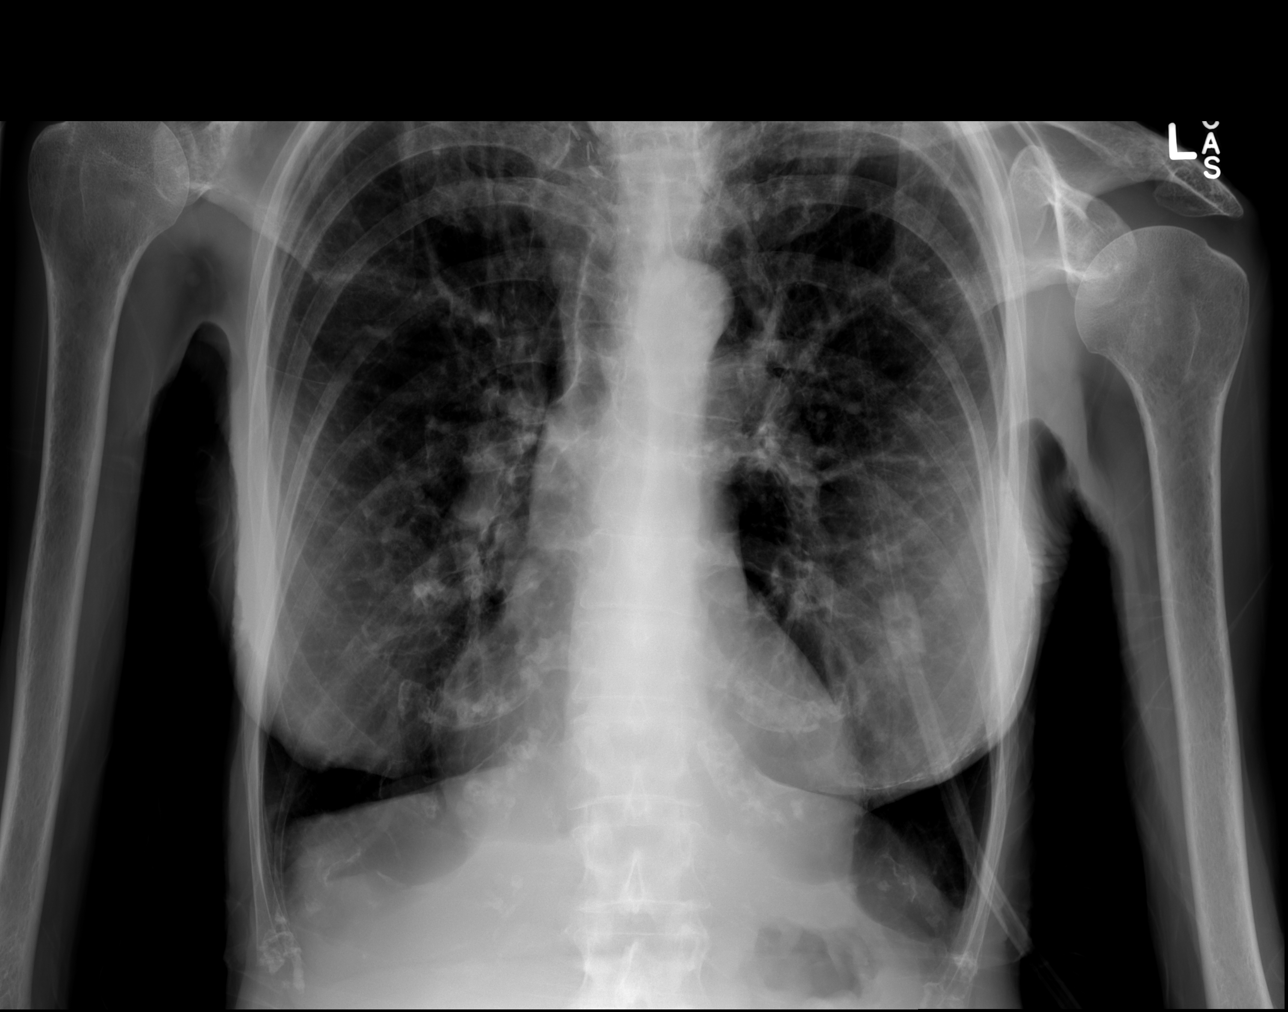

[x chest ap (2 of 2)]
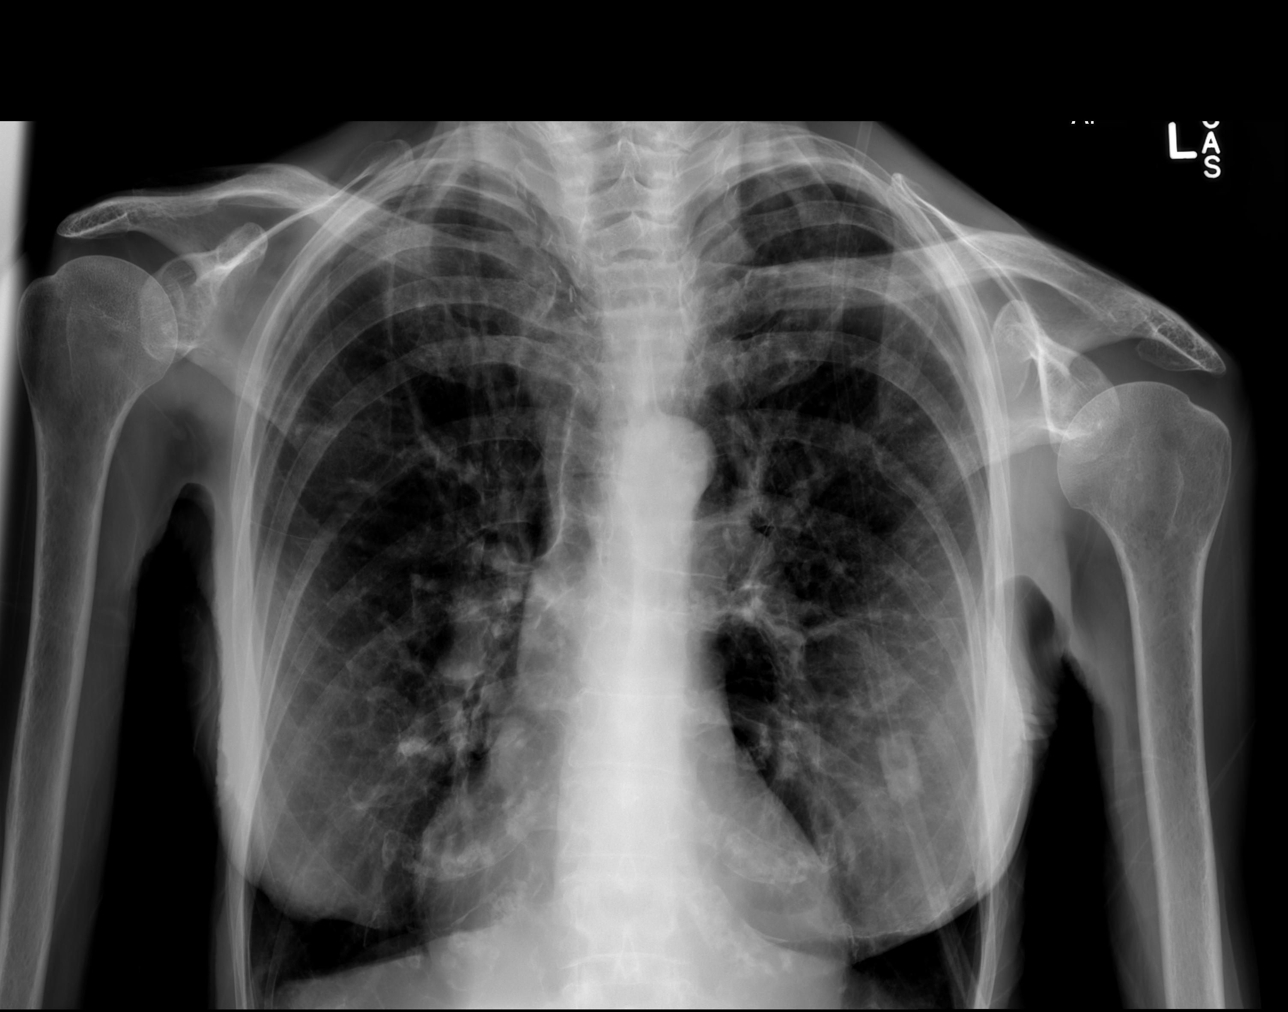

[3 of 3 positions shown; findings below may reference images not displayed]

FINDINGS: The heart is enlarged but stable. There are advanced emphysematous
changes and pulmonary scarring but no definite acute overlying
pulmonary process. The bony thorax is intact.
IMPRESSION: Chronic emphysematous changes but no acute pulmonary findings.

## 2015-04-09 IMAGING — CR DG ABDOMEN 2V
2 series · 2 of 2 positions shown · non-contrast
Comparison: None.

CLINICAL DATA: Abdominal pain and distention

EXAM:
ABDOMEN - 2 VIEW

[w abdomen upright *]
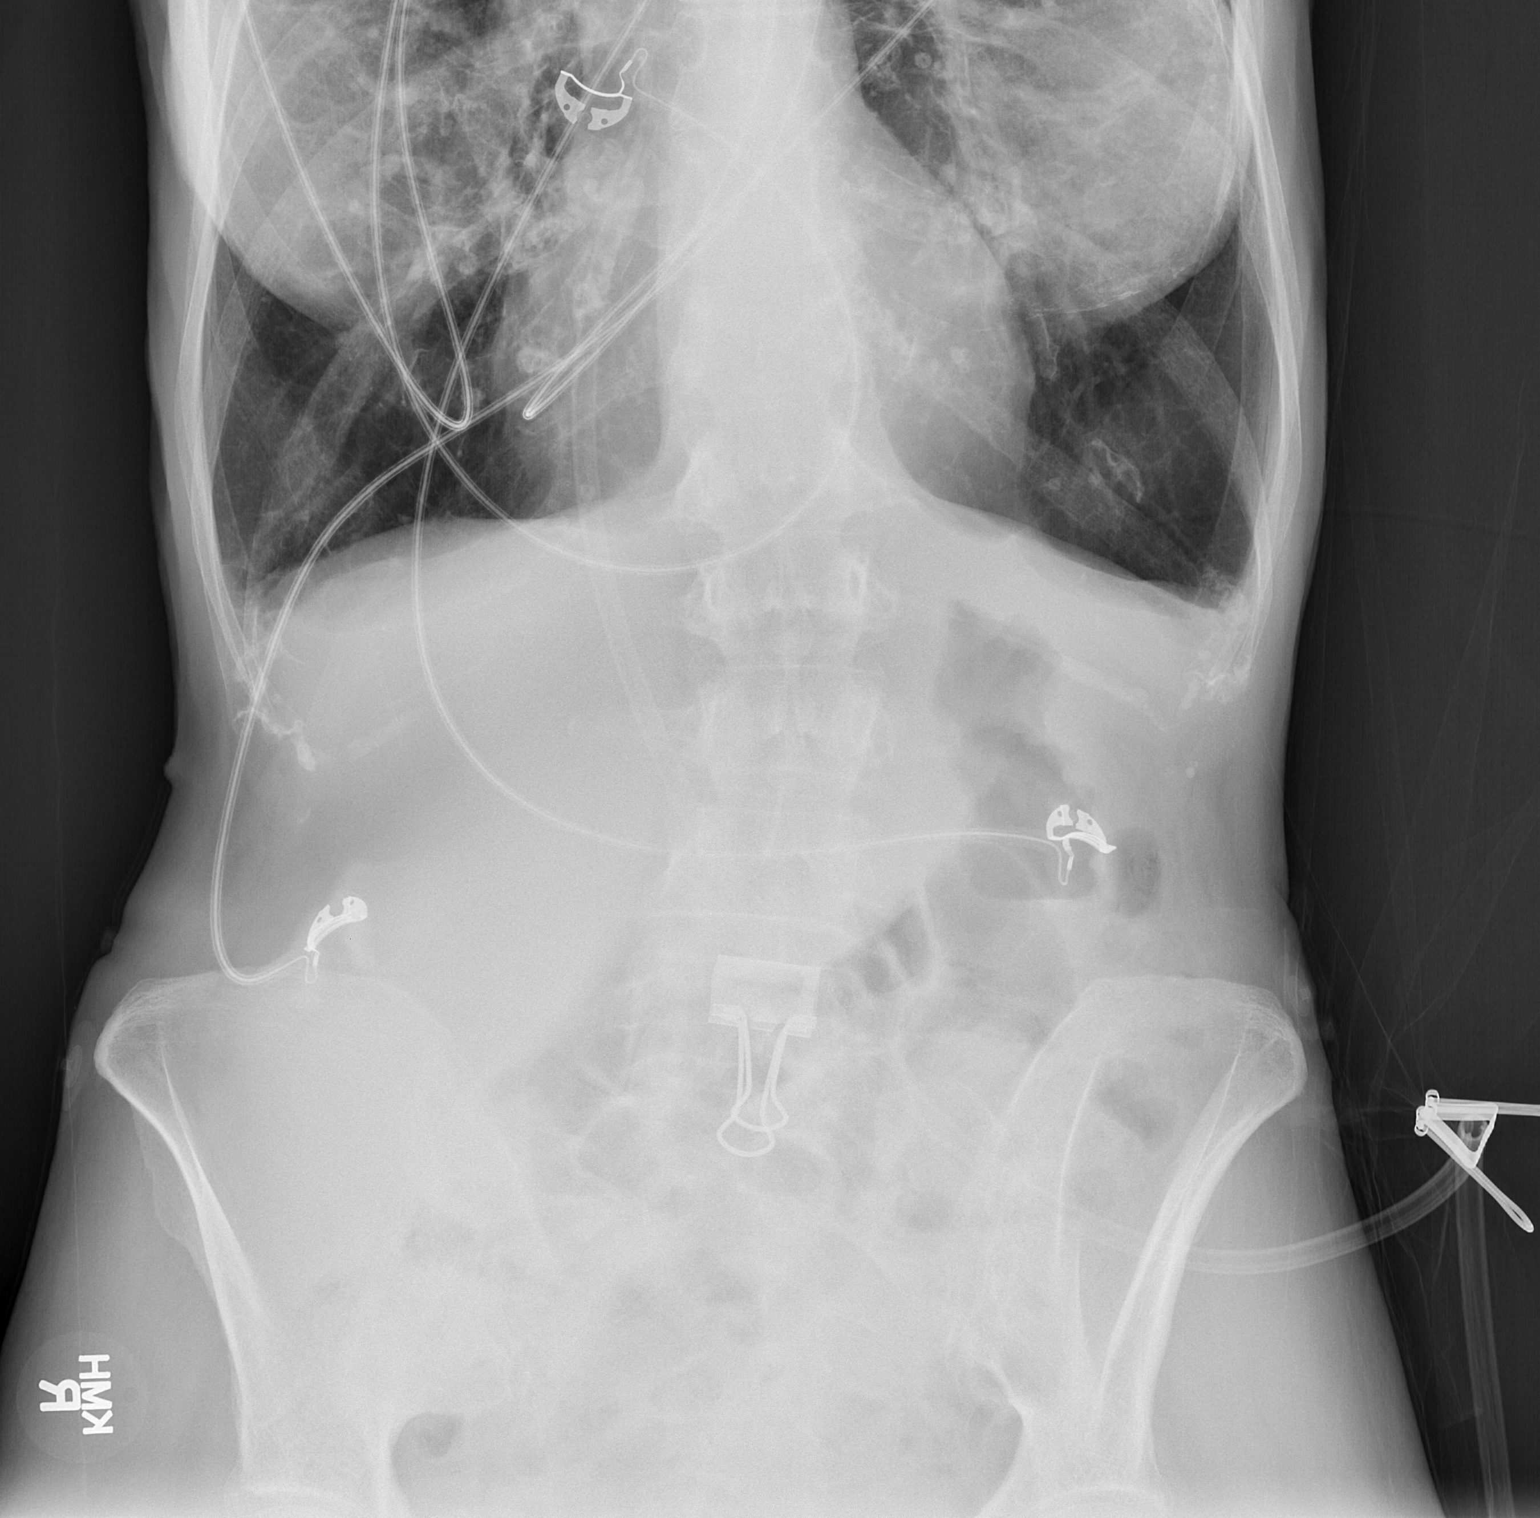

[t abdomen supine]
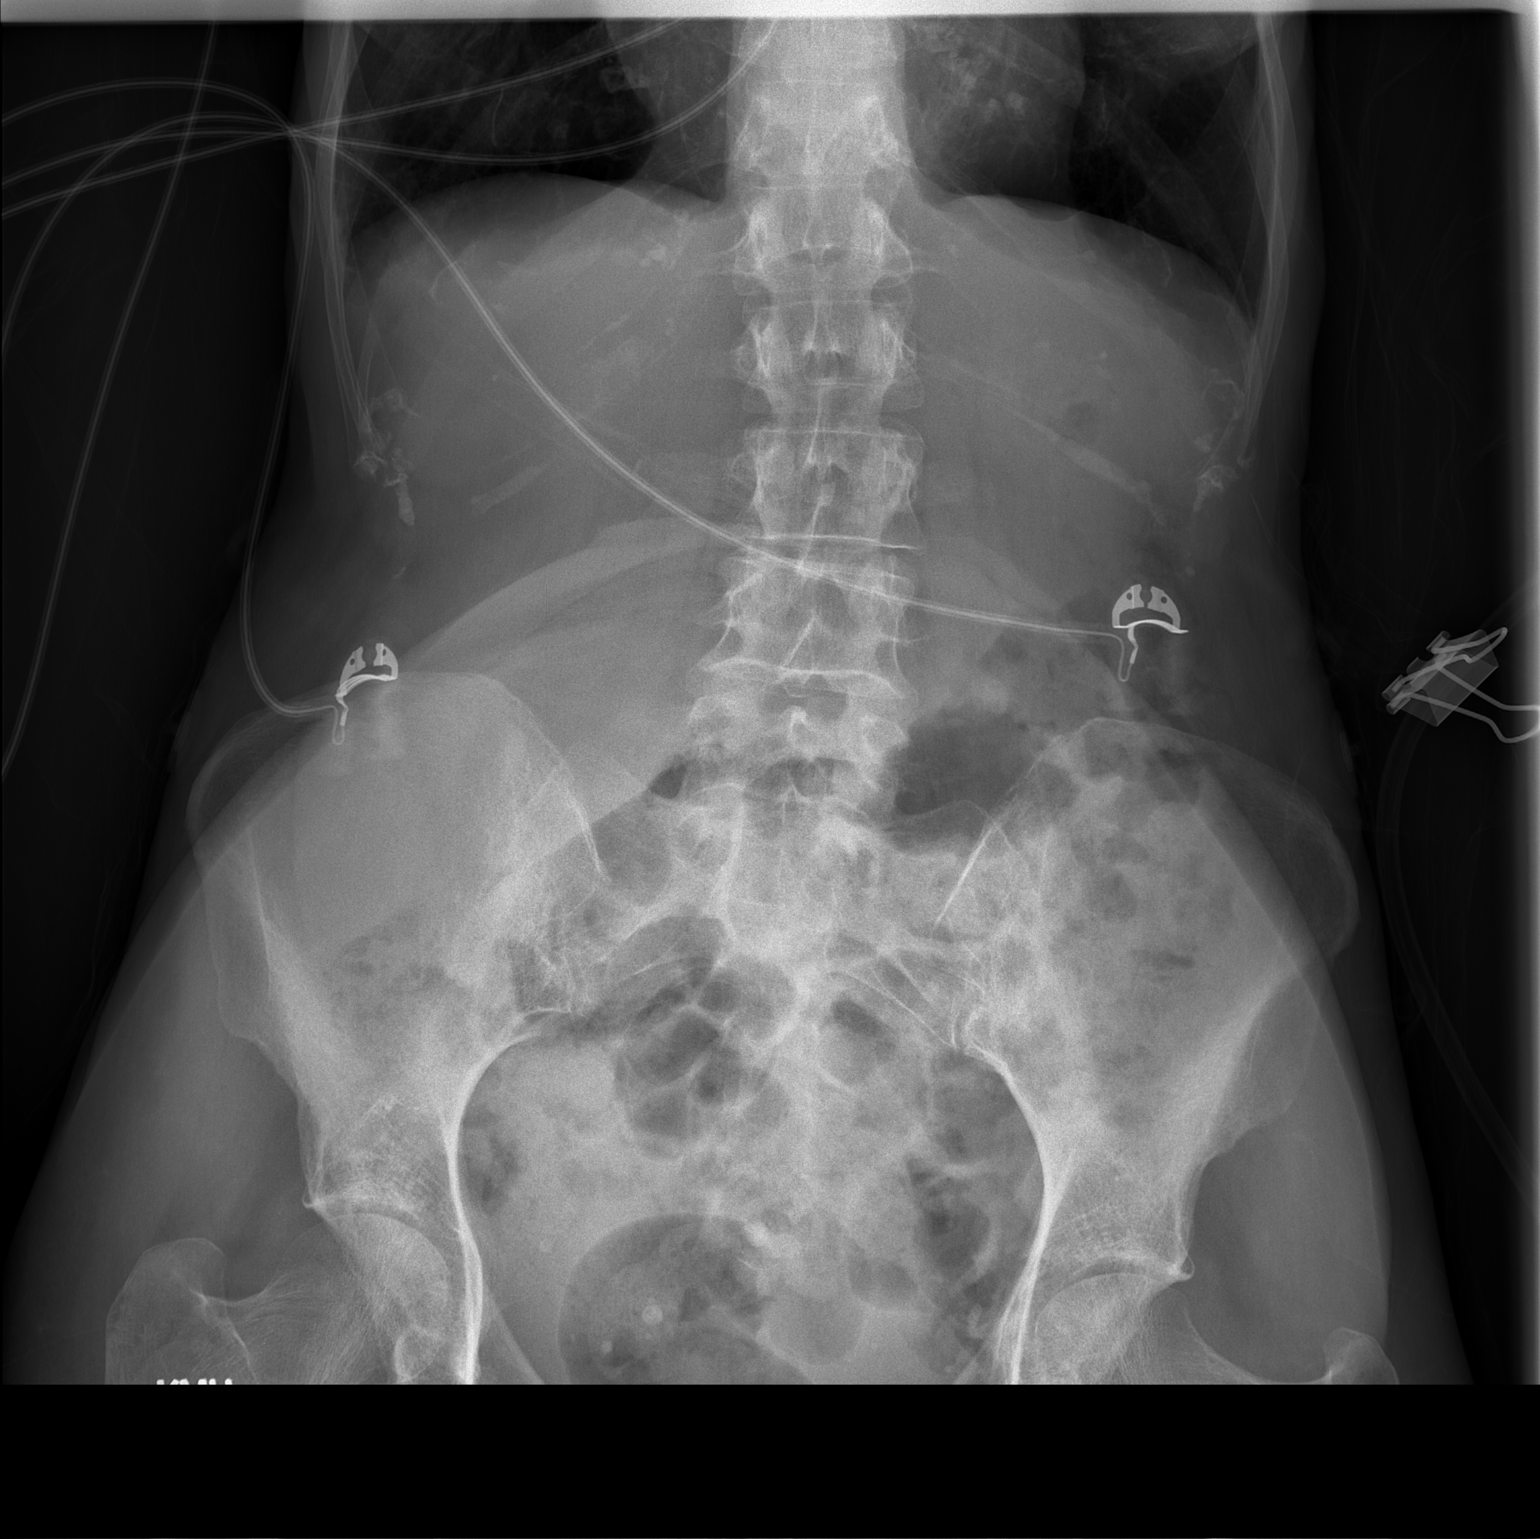

[2 of 2 positions shown; findings below may reference images not displayed]

FINDINGS: There is no free intraperitoneal gas. No disproportionate dilatation
of bowel. Phleboliths project over the pelvis.
IMPRESSION: Nonobstructive bowel gas pattern.

## 2015-04-10 IMAGING — CT CT ABD-PELV W/ CM
1 series · 15 of 32 positions shown, 19 images · IV contrast (OMNIPAQUE)
Comparison: None

CLINICAL DATA: Mid to lower abdominal pain with abdominal
distension, ankle and leg swelling, history COPD, asthma, assess for
venous patency or compression

EXAM:
CT ABDOMEN AND PELVIS WITH CONTRAST
TECHNIQUE: Multidetector CT imaging of the abdomen and pelvis was performed
using the standard protocol following bolus administration of
intravenous contrast. Sagittal and coronal MPR images reconstructed
from axial data set.
CONTRAST:  80mL OMNIPAQUE IOHEXOL 300 MG/ML SOLN IV. Dilute oral
contrast.

[Series 3: rtn ap with st · axial · 0.68mm/px · z∈[+664,+1018]mm · 15 of 80 slices shown, 19 images]
[im 6/80  soft-tissue]
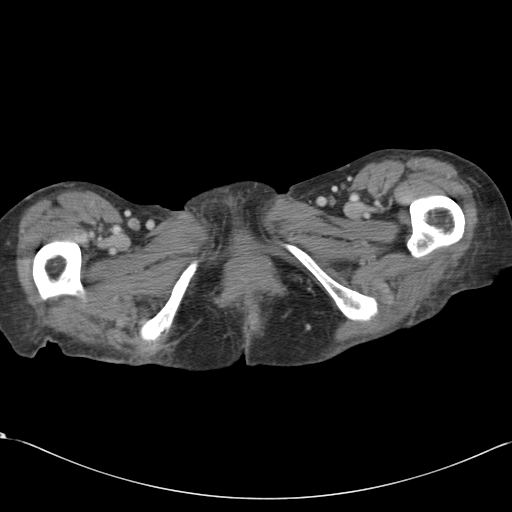
[im 6/80  bone]
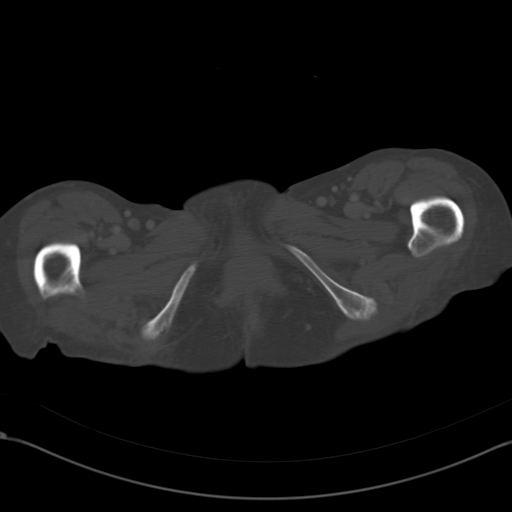
[im 11/80  soft-tissue]
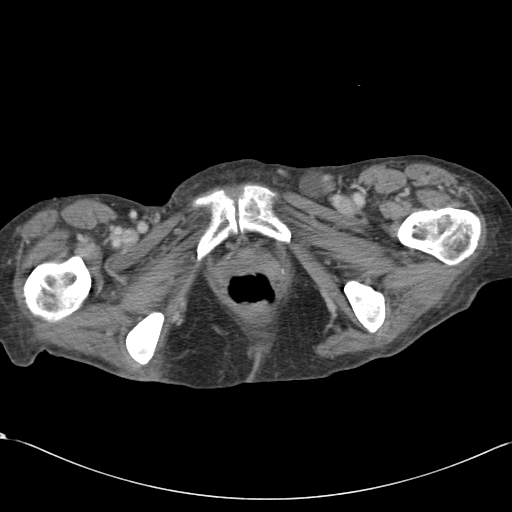
[im 16/80  soft-tissue]
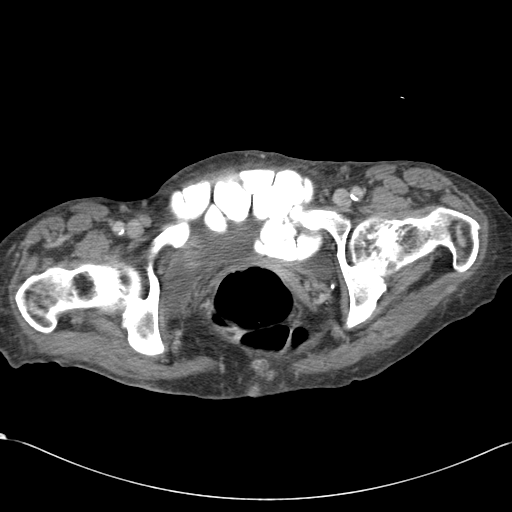
[im 23/80  soft-tissue]
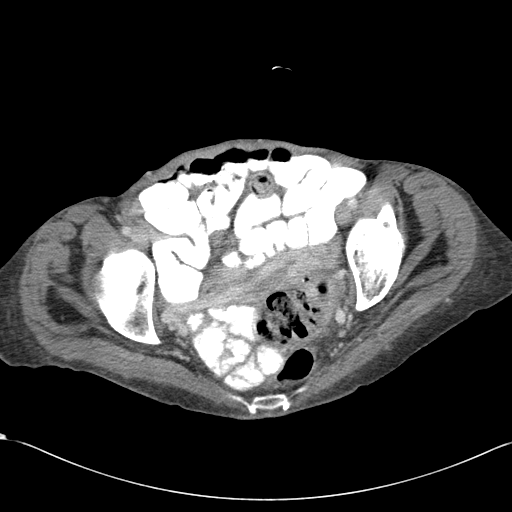
[im 29/80  soft-tissue]
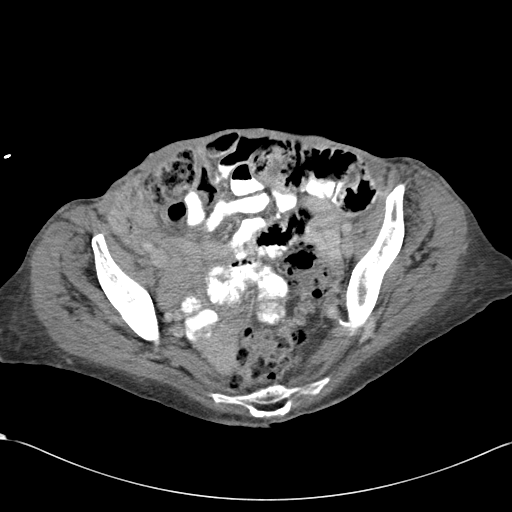
[im 34/80  soft-tissue]
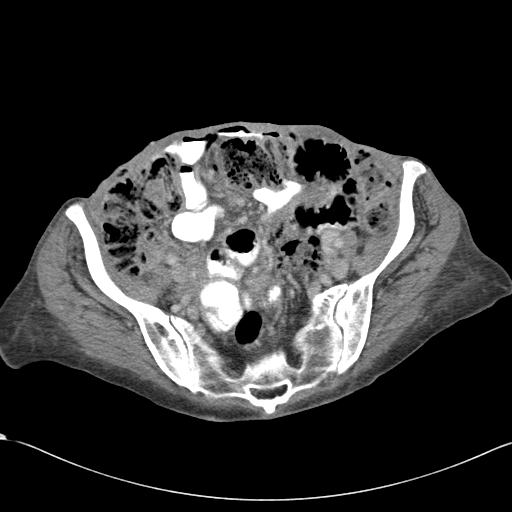
[im 41/80  soft-tissue]
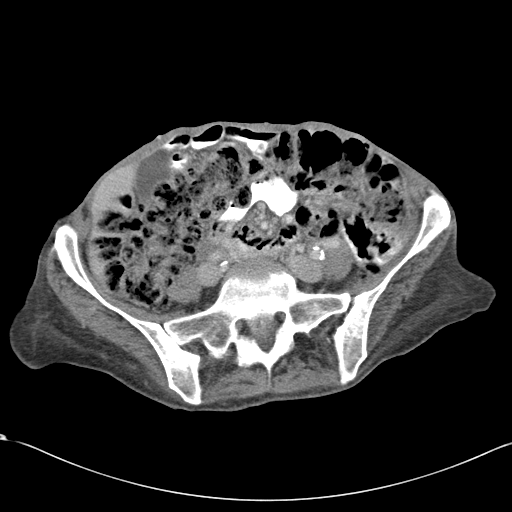
[im 46/80  soft-tissue]
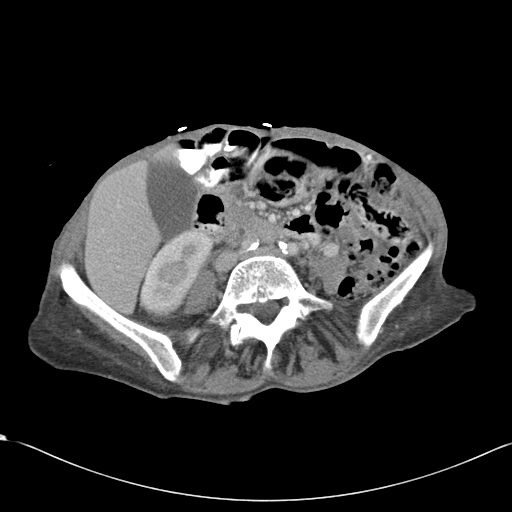
[im 51/80  soft-tissue]
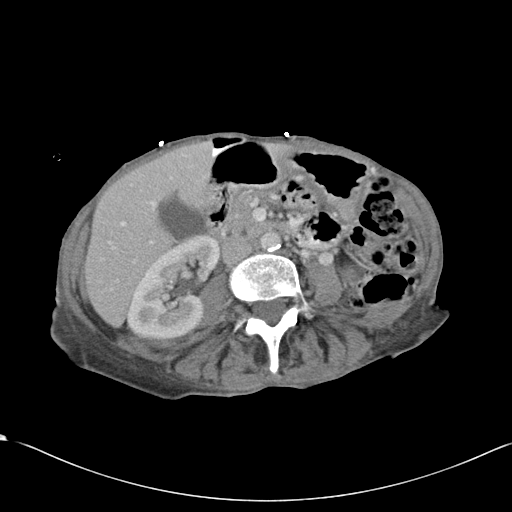
[im 51/80  bone]
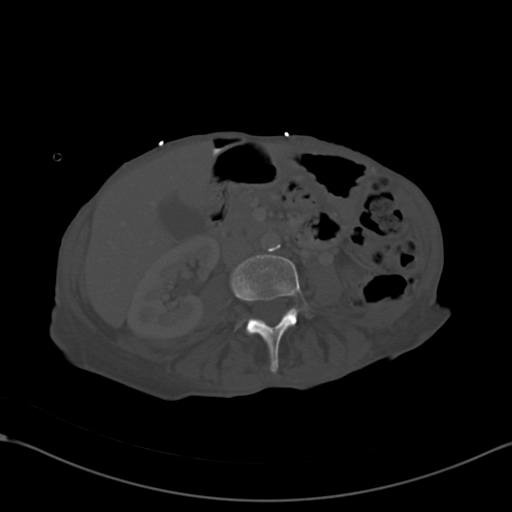
[im 57/80  soft-tissue]
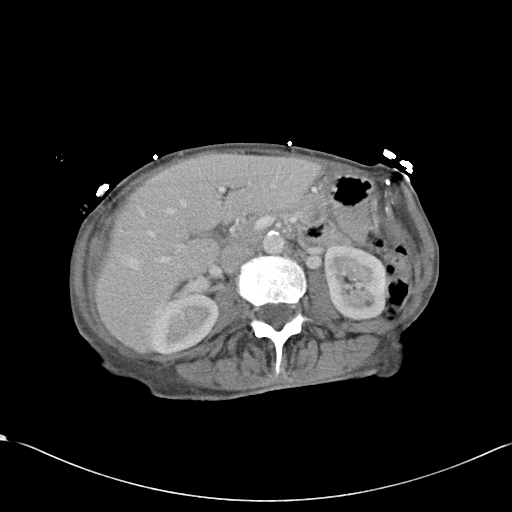
[im 64/80  soft-tissue]
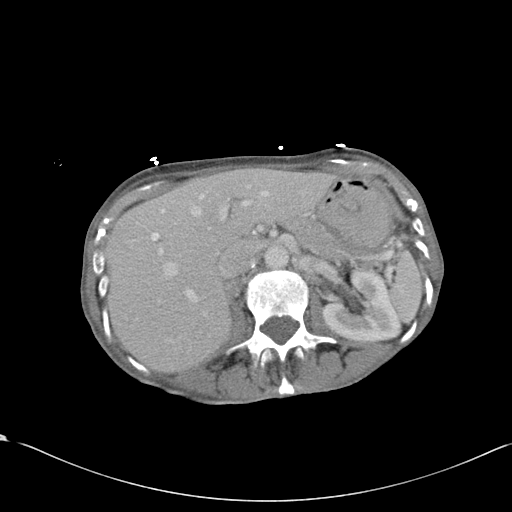
[im 69/80  soft-tissue]
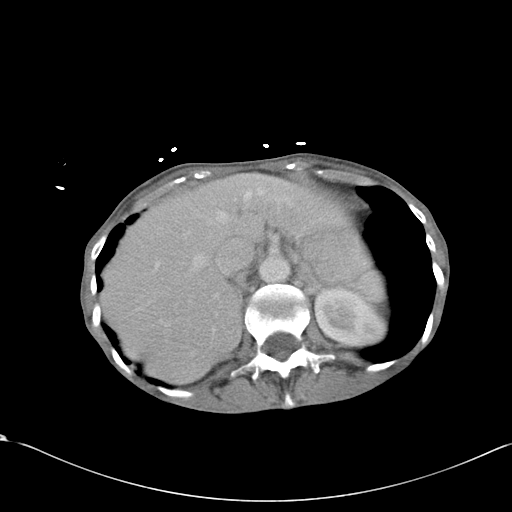
[im 69/80  lung]
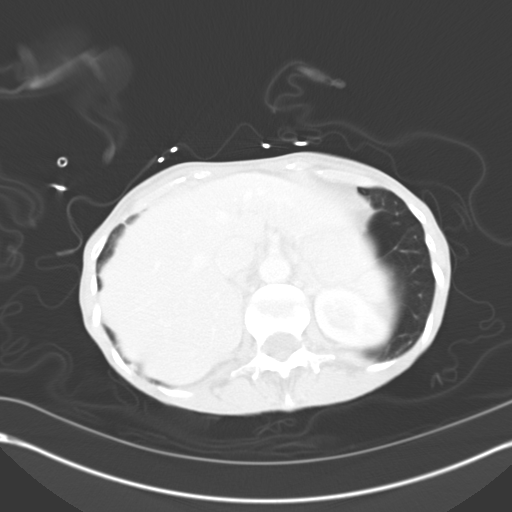
[im 72/80  lung]
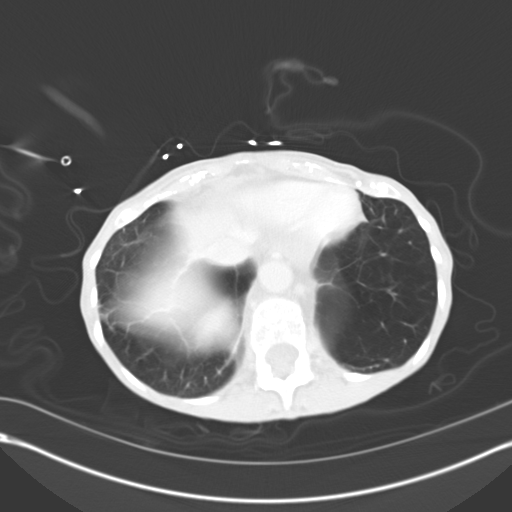
[im 74/80  soft-tissue]
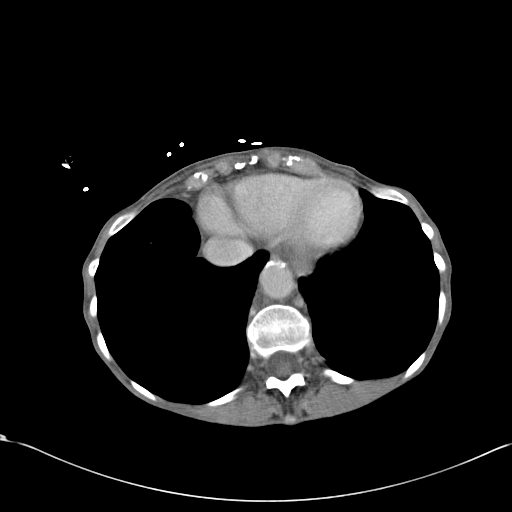
[im 74/80  lung]
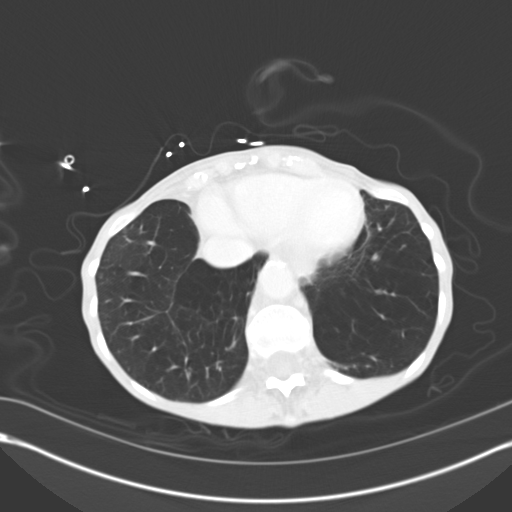
[im 77/80  lung]
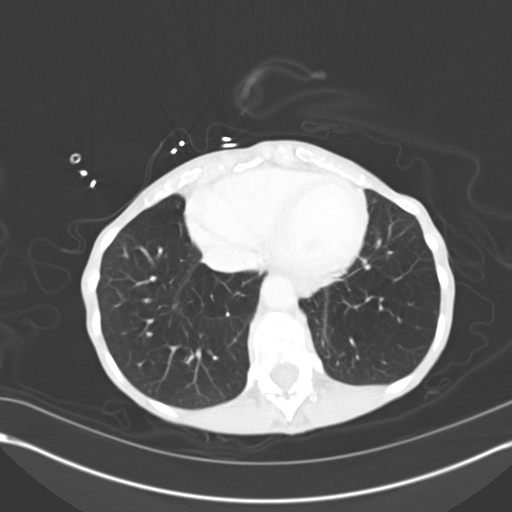

[15 of 32 positions shown; findings below may reference images not displayed]

FINDINGS: Emphysematous changes at lung bases without infiltrate.

Small LEFT renal cysts.

Liver, spleen, pancreas, kidneys, and adrenal glands normal
appearance.

Scattered atherosclerotic calcifications aorta.

Atrophic uterus with unremarkable ovaries.

Scattered pelvic phleboliths.

Increased number of vessels in the LEFT adnexal region/pelvis at
cannot exclude pelvic congestion.

Small fluid collection at LEFT inguinal region, nonspecific, 2.4 x
2.0 cm image 69.

Probable mild distal colonic diverticulosis.

Distal small bowel and colon are unopacified by contrast limiting
assessment, no additional gross abnormality seen.

Stomach and remaining small bowel loops normal appearance.

No additional mass, adenopathy, free fluid, or free air.

No acute osseous findings.
IMPRESSION: COPD changes at lung bases.

Suspected distal colonic diverticulosis.

Prominent vessels in LEFT adnexa cannot exclude pelvic congestion.

Small nonspecific fluid collection at the LEFT inguinal region 2.4 x
2.0 cm in size, nonspecific ; this could represent a subcutaneous
cyst, small seroma or lymphocele the patient is at prior inguinal
surgery or catheterization, less likely a fluid attenuation lymph
node.

No other significant intra-abdominal or intrapelvic findings
identified.
